# Patient Record
Sex: Female | Born: 1990 | Race: White | Hispanic: Yes | Marital: Single | Smoking: Never smoker
Health system: Southern US, Community
[De-identification: ages and names within clinical notes are randomized; demographics above are authoritative.]

## PROBLEM LIST (undated history)

## (undated) ENCOUNTER — Inpatient Hospital Stay (HOSPITAL_COMMUNITY): Payer: Self-pay

## (undated) DIAGNOSIS — E282 Polycystic ovarian syndrome: Secondary | ICD-10-CM

## (undated) DIAGNOSIS — E0590 Thyrotoxicosis, unspecified without thyrotoxic crisis or storm: Secondary | ICD-10-CM

## (undated) DIAGNOSIS — M419 Scoliosis, unspecified: Secondary | ICD-10-CM

## (undated) DIAGNOSIS — J45909 Unspecified asthma, uncomplicated: Secondary | ICD-10-CM

---
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
## 2011-12-30 ENCOUNTER — Inpatient Hospital Stay (HOSPITAL_COMMUNITY)
Admission: AD | Admit: 2011-12-30 | Discharge: 2011-12-30 | Disposition: A | Discharge status: Home / Self Care | Payer: Self-pay | Source: Ambulatory Visit | Attending: Obstetrics & Gynecology | Admitting: Obstetrics & Gynecology

## 2011-12-30 DIAGNOSIS — N949 Unspecified condition associated with female genital organs and menstrual cycle: Secondary | ICD-10-CM | POA: Insufficient documentation

## 2011-12-30 DIAGNOSIS — IMO0001 Reserved for inherently not codable concepts without codable children: Secondary | ICD-10-CM

## 2011-12-30 DIAGNOSIS — N938 Other specified abnormal uterine and vaginal bleeding: Secondary | ICD-10-CM | POA: Insufficient documentation

## 2011-12-30 DIAGNOSIS — R030 Elevated blood-pressure reading, without diagnosis of hypertension: Secondary | ICD-10-CM | POA: Insufficient documentation

## 2011-12-30 LAB — POCT PREGNANCY, URINE
Preg Test, Ur: NEGATIVE
Preg Test, Ur: NEGATIVE

## 2011-12-30 LAB — URINALYSIS, ROUTINE W REFLEX MICROSCOPIC
Ketones, ur: NEGATIVE mg/dL
Leukocytes, UA: NEGATIVE
Nitrite: POSITIVE — AB
Protein, ur: NEGATIVE mg/dL
pH: 5.5 (ref 5.0–8.0)

## 2011-12-30 NOTE — Discharge Instructions (Signed)
Hypertension Information As your heart beats, it forces blood through your arteries. This force is your blood pressure. If the pressure is too high, it is called hypertension (HTN) or high blood pressure. HTN is dangerous because you may have it and not know it. High blood pressure may mean that your heart has to work harder to pump blood. Your arteries may be narrow or stiff. The extra work puts you at risk for heart disease, stroke, and other problems.  Blood pressure consists of two numbers, a higher number over a lower, 110/72, for example. It is stated as "110 over 72." The ideal is below 120 for the top number (systolic) and under 80 for the bottom (diastolic).  You should pay close attention to your blood pressure if you have certain conditions such as:  Heart failure.   Prior heart attack.   Diabetes   Chronic kidney disease.   Prior stroke.   Multiple risk factors for heart disease.  To see if you have HTN, your blood pressure should be measured while you are seated with your arm held at the level of the heart. It should be measured at least twice. A one-time elevated blood pressure reading (especially in the Emergency Department) does not mean that you need treatment. There may be conditions in which the blood pressure is different between your right and left arms. It is important to see your caregiver soon for a recheck. Most people have essential hypertension which means that there is not a specific cause. This type of high blood pressure may be lowered by changing lifestyle factors such as:  Stress.   Smoking.   Lack of exercise.   Excessive weight.   Drug/tobacco/alcohol use.   Eating less salt.  Most people do not have symptoms from high blood pressure until it has caused damage to the body. Effective treatment can often prevent, delay or reduce that damage. TREATMENT  Treatment for high blood pressure, when a cause has been identified, is directed at the cause. There  are a large number of medications to treat HTN. These fall into several categories, and your caregiver will help you select the medicines that are best for you. Medications may have side effects. You should review side effects with your caregiver. If your blood pressure stays high after you have made lifestyle changes or started on medicines,   Your medication(s) may need to be changed.   Other problems may need to be addressed.   Be certain you understand your prescriptions, and know how and when to take your medicine.   Be sure to follow up with your caregiver within the time frame advised (usually within two weeks) to have your blood pressure rechecked and to review your medications.   If you are taking more than one medicine to lower your blood pressure, make sure you know how and at what times they should be taken. Taking two medicines at the same time can result in blood pressure that is too low.  Document Released: 12/06/2005 Document Revised: 06/15/2011 Document Reviewed: 12/13/2007 [facility] Patient Information 2012 [place], [place].Hypertension Hypertension is another name for high blood pressure. High blood pressure may mean that your heart needs to work harder to pump blood. Blood pressure consists of two numbers, which includes a higher number over a lower number (example: 110/72). HOME CARE   Make lifestyle changes as told by your doctor. This may include weight loss and exercise.   Take your blood pressure medicine every day.   Limit  how much salt you use.   Stop smoking if you smoke.   Do not use drugs.   Talk to your doctor if you are using decongestants or birth control pills. These medicines might make blood pressure higher.   Females should not drink more than 1 alcoholic drink per day. Males should not drink more than 2 alcoholic drinks per day.   See your doctor as told.  GET HELP RIGHT AWAY IF:   You have a blood pressure reading with a top number of 180 or  higher.   You get a very bad headache.   You get blurred or changing vision.   You feel confused.   You feel weak, numb, or faint.   You get chest or belly (abdominal) pain.   You throw up (vomit).   You cannot breathe very well.  MAKE SURE YOU:   Understand these instructions.   Will watch your condition.   Will get help right away if you are not doing well or get worse.  Document Released: 03/21/2008 Document Revised: 09/22/2011 Document Reviewed: 03/21/2008 [facility] Patient Information 2012 [place], [place].

## 2011-12-30 NOTE — MAU Note (Signed)
Pt states, " I missed my period on March 9 th, but spotted on Mon the 11 off and on until Wed and on Wed I had a severe pain at my belly button that lasted about 2 hrs. Today I have not had pain or bleeding. My pregnancy test was negative."

## 2011-12-30 NOTE — MAU Provider Note (Signed)
Debra A Booth20 y.o. Chief Complaint  Patient presents with  . Vaginal Bleeding    SUBJECTIVE  HPI: Nullip not using contraception requests preg test. Her HPT was neg 1 wk ago. LNMP 11/26/11. This months she had light flow 3/11-12 and moderate flow 12/28/11 and feels this is not normal for her. Sometimes her menses interval is 5 wks but does not skip months or have metrorrhagia.  Unaware of BP ever being elevated.  No past medical history on file. No past surgical history on file. History   Social History  . Marital Status: Married    Spouse Name: N/A    Number of Children: N/A  . Years of Education: N/A   Occupational History  . Not on file.   Social History Main Topics  . Smoking status: Not on file  . Smokeless tobacco: Not on file  . Alcohol Use: Not on file  . Drug Use: Not on file  . Sexually Active: Not on file   Other Topics Concern  . Not on file   Social History Narrative  . No narrative on file   No current facility-administered medications on file prior to encounter.   No current outpatient prescriptions on file prior to encounter.   Allergies not on file  ROS: Pertinent items in HPI  OBJECTIVE Blood pressure 134/93, pulse 119, temperature 97.4 F (36.3 C), temperature source Oral, resp. rate 20, height 5' 1.25" (1.556 m), weight 70.988 kg (156 lb 8 oz).  Initial BP 150/83  Results for orders placed during the hospital encounter of 12/30/11 (from the past 24 hour(s))  PREGNANCY, URINE     Status: Normal   Collection Time   12/30/11  3:55 PM      Component Value Range   Preg Test, Ur NEGATIVE  NEGATIVE   POCT PREGNANCY, URINE     Status: Normal   Collection Time   12/30/11  4:00 PM      Component Value Range   Preg Test, Ur NEGATIVE  NEGATIVE   POCT PREGNANCY, URINE     Status: Normal   Collection Time   12/30/11  4:17 PM      Component Value Range   Preg Test, Ur NEGATIVE  NEGATIVE    PE deferred  ASSESSMENT  Not pregnant Elevated  BP  PLAN Discussed contraception, keep a menstrual calendar, safe sex and need to have BP checked with [facility] or primary care provider; [facility] or [facility] for contraception.

## 2013-03-09 ENCOUNTER — Encounter (HOSPITAL_COMMUNITY): Payer: Self-pay | Admitting: *Deleted

## 2013-03-09 ENCOUNTER — Emergency Department (HOSPITAL_COMMUNITY)
Admission: EM | Admit: 2013-03-09 | Discharge: 2013-03-10 | Disposition: A | Discharge status: Home / Self Care | Payer: BC Managed Care – PPO | Attending: Emergency Medicine | Admitting: Emergency Medicine

## 2013-03-09 DIAGNOSIS — R1012 Left upper quadrant pain: Secondary | ICD-10-CM | POA: Insufficient documentation

## 2013-03-09 DIAGNOSIS — R109 Unspecified abdominal pain: Secondary | ICD-10-CM

## 2013-03-09 DIAGNOSIS — R110 Nausea: Secondary | ICD-10-CM | POA: Insufficient documentation

## 2013-03-09 DIAGNOSIS — R6883 Chills (without fever): Secondary | ICD-10-CM | POA: Insufficient documentation

## 2013-03-09 DIAGNOSIS — R61 Generalized hyperhidrosis: Secondary | ICD-10-CM | POA: Insufficient documentation

## 2013-03-09 DIAGNOSIS — Z880 Allergy status to penicillin: Secondary | ICD-10-CM | POA: Insufficient documentation

## 2013-03-09 DIAGNOSIS — K219 Gastro-esophageal reflux disease without esophagitis: Secondary | ICD-10-CM | POA: Insufficient documentation

## 2013-03-09 DIAGNOSIS — Z8739 Personal history of other diseases of the musculoskeletal system and connective tissue: Secondary | ICD-10-CM | POA: Insufficient documentation

## 2013-03-09 DIAGNOSIS — Z3202 Encounter for pregnancy test, result negative: Secondary | ICD-10-CM | POA: Insufficient documentation

## 2013-03-09 HISTORY — DX: Scoliosis, unspecified: M41.9

## 2013-03-09 LAB — COMPREHENSIVE METABOLIC PANEL
Alkaline Phosphatase: 120 U/L — ABNORMAL HIGH (ref 39–117)
BUN: 14 mg/dL (ref 6–23)
Calcium: 9.6 mg/dL (ref 8.4–10.5)
Creatinine, Ser: 0.81 mg/dL (ref 0.50–1.10)
GFR calc Af Amer: >90 mL/min (ref >90)
Glucose, Bld: 86 mg/dL (ref 70–99)
Potassium: 3.7 mEq/L (ref 3.5–5.1)
Total Protein: 7.7 g/dL (ref 6.0–8.3)

## 2013-03-09 LAB — CBC WITH DIFFERENTIAL/PLATELET
Eosinophils Absolute: 0.3 10*3/uL (ref 0.0–0.7)
Eosinophils Relative: 3 % (ref 0–5)
Hemoglobin: 13.5 g/dL (ref 12.0–15.0)
Lymphs Abs: 4.7 10*3/uL — ABNORMAL HIGH (ref 0.7–4.0)
MCH: 32.3 pg (ref 26.0–34.0)
MCV: 92.1 fL (ref 78.0–100.0)
Monocytes Absolute: 0.7 10*3/uL (ref 0.1–1.0)
Monocytes Relative: 6 % (ref 3–12)
RBC: 4.18 MIL/uL (ref 3.87–5.11)

## 2013-03-09 LAB — URINALYSIS, ROUTINE W REFLEX MICROSCOPIC
Bilirubin Urine: NEGATIVE
Nitrite: NEGATIVE
Specific Gravity, Urine: 1.02 (ref 1.005–1.030)
Urobilinogen, UA: 0.2 mg/dL (ref 0.0–1.0)

## 2013-03-09 LAB — LIPASE, BLOOD: Lipase: 38 U/L (ref 11–59)

## 2013-03-09 MED ORDER — HYDROCODONE-ACETAMINOPHEN 5-325 MG PO TABS
1.0000 | ORAL_TABLET | Freq: Once | ORAL | Status: AC
  Administered 2013-03-09: 1 via ORAL
  Filled 2013-03-09: qty 1

## 2013-03-09 NOTE — ED Notes (Addendum)
Patient with recurrent left sided abd pain x2 months described as stabbing and intermittent. Patient has nausea medications at home but no rx pain meds. Patient states pain is worse today. Patient denies taking any pain meds, including OTC, for pain PTA. Patient reports nausea is worsened when she does not eat for a long period of time. Patient states she eats twice daily. Patient states her WBC and Liver enzymes are both elevated per the GI dr from labs collected about 2 weeks ago.

## 2013-03-09 NOTE — ED Notes (Signed)
Pt states that she has had abdominal pain off and on x 2 months; pt has been seen by OB/GYN and GI; mom reports that they are waiting for test results from GI; pt c/o feeling nausea and abd pain again today; mom reports that GI advised that she had elevated WBC and something with her liver enzymes; mom is concerned that pain returned today and having these elevatiosn in her lab work.

## 2013-03-09 NOTE — ED Provider Notes (Signed)
History     CSN: [id number]  Arrival date & time 03/09/13  2106   First MD Initiated Contact with Patient 03/09/13 2234      Chief Complaint  Patient presents with  . Abdominal Pain   HPI  History provided by patient and mother. Patient is a 22 year old female with no significant PMH who presents with complaints of worsening abdominal pain and nausea symptoms. She has had persistent symptoms with some waxing and waning features for the past 2 months. One month ago she was initially evaluated by OB/GYN who referred her to a GI specialist. Patient was seen by Dr. Loreta Ave and states that she had blood testing performed. Recently she was called and told that she had slightly elevated liver enzymes as well as a slightly elevated WBC. Patient is planning to follow back up with GI but has no appointment at this time. Her pain is primarily in the upper left side of her abdomen. It's described as sharp and cramping. She denies having any changes in her bowel habits. Denies any constipation or diarrhea. She has usually one bowel movement every day. There has not been any blood or mucus. She has slight nausea but no change in appetite. Denies any vomiting episodes. There is any fever but does report occasional chills and sweats. Her symptoms are aggravated or alleviated by eating, position or defecation. Patient does have a prescription for Zofran which she uses for nausea. She is currently not using any medications including over-the-counter medications for pain symptoms. Denies any urinary symptoms. Denies any vaginal bleeding or vaginal discharge.   Past Medical History  Diagnosis Date  . Scoliosis     History reviewed. No pertinent past surgical history.  No family history on file.  History  Substance Use Topics  . Smoking status: Never Smoker   . Smokeless tobacco: Not on file  . Alcohol Use: No    OB History   Grav Para Term Preterm Abortions TAB SAB Ect Mult Living                   Review of Systems  Constitutional: Positive for chills and diaphoresis. Negative for fever.  Respiratory: Negative for shortness of breath.   Cardiovascular: Negative for chest pain.  Gastrointestinal: Positive for nausea and abdominal pain. Negative for vomiting, diarrhea, constipation and blood in stool.  Genitourinary: Negative for dysuria, frequency, hematuria, flank pain, vaginal bleeding and vaginal discharge.  All other systems reviewed and are negative.    Allergies  Amoxicillin  Home Medications   Current Outpatient Rx  Name  Route  Sig  Dispense  Refill  . PRESCRIPTION MEDICATION   Oral   Take 1 tablet by mouth every 8 (eight) hours as needed (nausea). Nausea med prescribed by obgyn           BP 117/82  Pulse 66  Temp(Src) 98.2 F (36.8 C) (Oral)  Resp 12  SpO2 100%  LMP 12/23/2012  Physical Exam  Nursing note and vitals reviewed. Constitutional: She is oriented to person, place, and time. She appears well-developed and well-nourished. No distress.  HENT:  Head: Normocephalic.  Cardiovascular: Normal rate and regular rhythm.   Pulmonary/Chest: Effort normal and breath sounds normal. No respiratory distress. She has no wheezes. She has no rales.  Abdominal: Soft. There is tenderness in the left upper quadrant. There is no rigidity, no rebound, no guarding, no CVA tenderness, no tenderness at McBurney's point and negative Murphy's sign.  Musculoskeletal: Normal range of motion.  Neurological: She is alert and oriented to person, place, and time.  Skin: Skin is warm and dry. No rash noted.  Psychiatric: She has a normal mood and affect. Her behavior is normal.    ED Course  Procedures   Results for orders placed during the hospital encounter of 03/09/13  CBC WITH DIFFERENTIAL      Result Value Range   WBC 11.7 (*) 4.0 - 10.5 K/uL   RBC 4.18  3.87 - 5.11 MIL/uL   Hemoglobin 13.5  12.0 - 15.0 g/dL   HCT 40.9  81.1 - 91.4 %   MCV 92.1  78.0 - 100.0  fL   MCH 32.3  26.0 - 34.0 pg   MCHC 35.1  30.0 - 36.0 g/dL   RDW 78.2  95.6 - 21.3 %   Platelets 309  150 - 400 K/uL   Neutrophils Relative % 51  43 - 77 %   Neutro Abs 5.9  1.7 - 7.7 K/uL   Lymphocytes Relative 40  12 - 46 %   Lymphs Abs 4.7 (*) 0.7 - 4.0 K/uL   Monocytes Relative 6  3 - 12 %   Monocytes Absolute 0.7  0.1 - 1.0 K/uL   Eosinophils Relative 3  0 - 5 %   Eosinophils Absolute 0.3  0.0 - 0.7 K/uL   Basophils Relative 1  0 - 1 %   Basophils Absolute 0.1  0.0 - 0.1 K/uL  COMPREHENSIVE METABOLIC PANEL      Result Value Range   Sodium 138  135 - 145 mEq/L   Potassium 3.7  3.5 - 5.1 mEq/L   Chloride 102  96 - 112 mEq/L   CO2 28  19 - 32 mEq/L   Glucose, Bld 86  70 - 99 mg/dL   BUN 14  6 - 23 mg/dL   Creatinine, Ser 0.86  0.50 - 1.10 mg/dL   Calcium 9.6  8.4 - 57.8 mg/dL   Total Protein 7.7  6.0 - 8.3 g/dL   Albumin 3.6  3.5 - 5.2 g/dL   AST 24  0 - 37 U/L   ALT 19  0 - 35 U/L   Alkaline Phosphatase 120 (*) 39 - 117 U/L   Total Bilirubin 0.3  0.3 - 1.2 mg/dL   GFR calc non Af Amer >90  >90 mL/min   GFR calc Af Amer >90  >90 mL/min  LIPASE, BLOOD      Result Value Range   Lipase 38  11 - 59 U/L  URINALYSIS, ROUTINE W REFLEX MICROSCOPIC      Result Value Range   Color, Urine YELLOW  YELLOW   APPearance CLEAR  CLEAR   Specific Gravity, Urine 1.020  1.005 - 1.030   pH 5.0  5.0 - 8.0   Glucose, UA NEGATIVE  NEGATIVE mg/dL   Hgb urine dipstick NEGATIVE  NEGATIVE   Bilirubin Urine NEGATIVE  NEGATIVE   Ketones, ur NEGATIVE  NEGATIVE mg/dL   Protein, ur NEGATIVE  NEGATIVE mg/dL   Urobilinogen, UA 0.2  0.0 - 1.0 mg/dL   Nitrite NEGATIVE  NEGATIVE   Leukocytes, UA NEGATIVE  NEGATIVE  POCT PREGNANCY, URINE      Result Value Range   Preg Test, Ur NEGATIVE  NEGATIVE        1. Abdominal pain   2. GERD (gastroesophageal reflux disease)       MDM  10:40 PM patient seen and evaluated. Patient is sitting appears comfortable in the bed in no  acute distress.  Symptoms have been ongoing for 2 months.   Patient reports feeling much better after pain medications. She has soft unremarkable abdominal exam with mild pains in the left upper quadrant. No signs of acute abdomen. Labs without any significant concerning findings. At this time recommended patient began with her antacid medications to see if symptoms improve over the next one to 2 weeks. Patient also instructed to continue following up with her GI specialist.     Angus Seller, PA-C 03/10/13 [phone]

## 2013-03-10 NOTE — ED Notes (Signed)
Patient discharged during downtime. Please see paper charting. 

## 2013-03-11 NOTE — ED Provider Notes (Signed)
Medical screening examination/treatment/procedure(s) were performed by non-physician practitioner and as supervising physician I was immediately available for consultation/collaboration.    Vida Roller, MD 03/11/13 [phone]

## 2013-05-27 ENCOUNTER — Emergency Department (HOSPITAL_COMMUNITY): Payer: BC Managed Care – PPO

## 2013-05-27 ENCOUNTER — Emergency Department (HOSPITAL_COMMUNITY)
Admission: EM | Admit: 2013-05-27 | Discharge: 2013-05-28 | Disposition: A | Discharge status: Home / Self Care | Payer: BC Managed Care – PPO | Attending: Emergency Medicine | Admitting: Emergency Medicine

## 2013-05-27 ENCOUNTER — Encounter (HOSPITAL_COMMUNITY): Payer: Self-pay | Admitting: Emergency Medicine

## 2013-05-27 DIAGNOSIS — R5383 Other fatigue: Secondary | ICD-10-CM | POA: Insufficient documentation

## 2013-05-27 DIAGNOSIS — Z3202 Encounter for pregnancy test, result negative: Secondary | ICD-10-CM | POA: Insufficient documentation

## 2013-05-27 DIAGNOSIS — B349 Viral infection, unspecified: Secondary | ICD-10-CM

## 2013-05-27 DIAGNOSIS — R5381 Other malaise: Secondary | ICD-10-CM | POA: Insufficient documentation

## 2013-05-27 DIAGNOSIS — B9789 Other viral agents as the cause of diseases classified elsewhere: Secondary | ICD-10-CM | POA: Insufficient documentation

## 2013-05-27 DIAGNOSIS — R109 Unspecified abdominal pain: Secondary | ICD-10-CM | POA: Insufficient documentation

## 2013-05-27 DIAGNOSIS — R110 Nausea: Secondary | ICD-10-CM | POA: Insufficient documentation

## 2013-05-27 DIAGNOSIS — IMO0001 Reserved for inherently not codable concepts without codable children: Secondary | ICD-10-CM | POA: Insufficient documentation

## 2013-05-27 DIAGNOSIS — R51 Headache: Secondary | ICD-10-CM | POA: Insufficient documentation

## 2013-05-27 DIAGNOSIS — M4120 Other idiopathic scoliosis, site unspecified: Secondary | ICD-10-CM | POA: Insufficient documentation

## 2013-05-27 LAB — CBC WITH DIFFERENTIAL/PLATELET
Basophils Absolute: 0 10*3/uL (ref 0.0–0.1)
Basophils Relative: 0 % (ref 0–1)
Eosinophils Absolute: 0.2 10*3/uL (ref 0.0–0.7)
Eosinophils Relative: 2 % (ref 0–5)
HCT: 42.6 % (ref 36.0–46.0)
MCH: 32.8 pg (ref 26.0–34.0)
MCHC: 36.6 g/dL — ABNORMAL HIGH (ref 30.0–36.0)
MCV: 89.5 fL (ref 78.0–100.0)
Monocytes Absolute: 0.5 10*3/uL (ref 0.1–1.0)
RDW: 11.7 % (ref 11.5–15.5)

## 2013-05-27 LAB — URINALYSIS, ROUTINE W REFLEX MICROSCOPIC
Glucose, UA: NEGATIVE mg/dL
Hgb urine dipstick: NEGATIVE
Ketones, ur: NEGATIVE mg/dL
Leukocytes, UA: NEGATIVE
Protein, ur: NEGATIVE mg/dL

## 2013-05-27 LAB — COMPREHENSIVE METABOLIC PANEL
AST: 21 U/L (ref 0–37)
Albumin: 3.8 g/dL (ref 3.5–5.2)
Calcium: 9.9 mg/dL (ref 8.4–10.5)
Creatinine, Ser: 0.66 mg/dL (ref 0.50–1.10)

## 2013-05-27 MED ORDER — SODIUM CHLORIDE 0.9 % IV BOLUS (SEPSIS)
1000.0000 mL | Freq: Once | INTRAVENOUS | Status: AC
  Administered 2013-05-27: 1000 mL via INTRAVENOUS

## 2013-05-27 MED ORDER — SODIUM CHLORIDE 0.9 % IV SOLN
INTRAVENOUS | Status: DC
  Administered 2013-05-27: 22:00:00 via INTRAVENOUS

## 2013-05-27 MED ORDER — ONDANSETRON 4 MG PO TBDP: 4.0000 mg | ORAL_TABLET | Freq: Three times a day (TID) | ORAL | Status: DC | PRN

## 2013-05-27 MED ORDER — PROMETHAZINE HCL 25 MG PO TABS: 25.0000 mg | ORAL_TABLET | Freq: Four times a day (QID) | ORAL | Status: DC | PRN

## 2013-05-27 MED ORDER — ONDANSETRON HCL 4 MG/2ML IJ SOLN
4.0000 mg | Freq: Once | INTRAMUSCULAR | Status: AC
  Administered 2013-05-27: 4 mg via INTRAVENOUS
  Filled 2013-05-27: qty 2

## 2013-05-27 NOTE — ED Notes (Signed)
Pt c/o nausea x 2 days and chills

## 2013-05-28 NOTE — ED Provider Notes (Signed)
CSN: [id number]     Arrival date & time 05/27/13  1639 History     First MD Initiated Contact with Patient 05/27/13 2043     Chief Complaint  Patient presents with  . Chills  . Nausea   (Consider location/radiation/quality/duration/timing/severity/associated sxs/prior Treatment) The history is provided by the patient and a parent.   22 year old female with 2 day history of fever chills and nausea no vomiting the bodyaches mild headache no dysuria plus or minus abdominal pain. No history of recent travel no history of recent bites.  Past Medical History  Diagnosis Date  . Scoliosis    History reviewed. No pertinent past surgical history. History reviewed. No pertinent family history. History  Substance Use Topics  . Smoking status: Never Smoker   . Smokeless tobacco: Not on file  . Alcohol Use: No   OB History   Grav Para Term Preterm Abortions TAB SAB Ect Mult Living                 Review of Systems  Constitutional: Positive for fever, chills and fatigue.  HENT: Negative for congestion and neck pain.   Eyes: Negative for redness.  Respiratory: Negative for cough and shortness of breath.   Cardiovascular: Negative for chest pain and leg swelling.  Gastrointestinal: Positive for nausea and abdominal pain. Negative for vomiting and diarrhea.  Genitourinary: Negative for dysuria.  Musculoskeletal: Positive for myalgias.  Skin: Negative for rash.  Allergic/Immunologic: Negative for immunocompromised state.  Neurological: Positive for headaches.  Hematological: Does not bruise/bleed easily.  Psychiatric/Behavioral: Negative for confusion.    Allergies  Amoxicillin  Home Medications   Current Outpatient Rx  Name  Route  Sig  Dispense  Refill  . acetaminophen (TYLENOL) 500 MG tablet   Oral   Take 1,000 mg by mouth daily as needed for pain.         Marland Kitchen ondansetron (ZOFRAN ODT) 4 MG disintegrating tablet   Oral   Take 1 tablet (4 mg total) by mouth every 8 (eight)  hours as needed for nausea.   10 tablet   0   . promethazine (PHENERGAN) 25 MG tablet   Oral   Take 1 tablet (25 mg total) by mouth every 6 (six) hours as needed for nausea.   12 tablet   0    BP 114/61  Pulse 93  Temp(Src) 97.6 F (36.4 C) (Oral)  Resp 17  SpO2 99%  LMP 05/07/2013 Physical Exam  Nursing note and vitals reviewed. Constitutional: She is oriented to person, place, and time. She appears well-developed and well-nourished. No distress.  HENT:  Head: Normocephalic and atraumatic.  Mouth/Throat: Oropharynx is clear and moist. No oropharyngeal exudate.  Eyes: Conjunctivae and EOM are normal. Pupils are equal, round, and reactive to light.  Neck: Normal range of motion. Neck supple.  Cardiovascular: Normal rate, regular rhythm and intact distal pulses.   No murmur heard. Pulmonary/Chest: Effort normal and breath sounds normal. No respiratory distress.  Abdominal: Soft. Bowel sounds are normal. There is no tenderness.  Musculoskeletal: Normal range of motion. She exhibits no edema.  Lymphadenopathy:    She has no cervical adenopathy.  Neurological: She is alert and oriented to person, place, and time. No cranial nerve deficit. She exhibits normal muscle tone. Coordination normal.  Skin: Skin is warm. No rash noted.    ED Course   Procedures (including critical care time)  Labs Reviewed  URINALYSIS, ROUTINE W REFLEX MICROSCOPIC - Abnormal; Notable for the following:  APPearance CLOUDY (*)    All other components within normal limits  CBC WITH DIFFERENTIAL - Abnormal; Notable for the following:    WBC 11.2 (*)    Hemoglobin 15.6 (*)    MCHC 36.6 (*)    Lymphs Abs 4.8 (*)    All other components within normal limits  COMPREHENSIVE METABOLIC PANEL   Dg Chest 2 View  05/27/2013   *RADIOLOGY REPORT*  Clinical Data: Cold symptoms  CHEST - 2 VIEW  Comparison: 09/02/2010  Findings: Rightward curvature of the thoracic spine is noted centered at T8.  Cardiomediastinal silhouette is within normal limits. The lungs are clear. No pleural effusion.  No pneumothorax. No acute osseous abnormality.  IMPRESSION: No acute cardiopulmonary process.   Original Report Authenticated By: Christiana Pellant, M.D.    Results for orders placed during the hospital encounter of 05/27/13  URINALYSIS, ROUTINE W REFLEX MICROSCOPIC      Result Value Range   Color, Urine YELLOW  YELLOW   APPearance CLOUDY (*) CLEAR   Specific Gravity, Urine 1.014  1.005 - 1.030   pH 6.0  5.0 - 8.0   Glucose, UA NEGATIVE  NEGATIVE mg/dL   Hgb urine dipstick NEGATIVE  NEGATIVE   Bilirubin Urine NEGATIVE  NEGATIVE   Ketones, ur NEGATIVE  NEGATIVE mg/dL   Protein, ur NEGATIVE  NEGATIVE mg/dL   Urobilinogen, UA 0.2  0.0 - 1.0 mg/dL   Nitrite NEGATIVE  NEGATIVE   Leukocytes, UA NEGATIVE  NEGATIVE  CBC WITH DIFFERENTIAL      Result Value Range   WBC 11.2 (*) 4.0 - 10.5 K/uL   RBC 4.76  3.87 - 5.11 MIL/uL   Hemoglobin 15.6 (*) 12.0 - 15.0 g/dL   HCT 16.1  09.6 - 04.5 %   MCV 89.5  78.0 - 100.0 fL   MCH 32.8  26.0 - 34.0 pg   MCHC 36.6 (*) 30.0 - 36.0 g/dL   RDW 40.9  81.1 - 91.4 %   Platelets 346  150 - 400 K/uL   Neutrophils Relative % 51  43 - 77 %   Neutro Abs 5.7  1.7 - 7.7 K/uL   Lymphocytes Relative 43  12 - 46 %   Lymphs Abs 4.8 (*) 0.7 - 4.0 K/uL   Monocytes Relative 4  3 - 12 %   Monocytes Absolute 0.5  0.1 - 1.0 K/uL   Eosinophils Relative 2  0 - 5 %   Eosinophils Absolute 0.2  0.0 - 0.7 K/uL   Basophils Relative 0  0 - 1 %   Basophils Absolute 0.0  0.0 - 0.1 K/uL  COMPREHENSIVE METABOLIC PANEL      Result Value Range   Sodium 141  135 - 145 mEq/L   Potassium 4.2  3.5 - 5.1 mEq/L   Chloride 104  96 - 112 mEq/L   CO2 25  19 - 32 mEq/L   Glucose, Bld 99  70 - 99 mg/dL   BUN 13  6 - 23 mg/dL   Creatinine, Ser 7.82  0.50 - 1.10 mg/dL   Calcium 9.9  8.4 - 95.6 mg/dL   Total Protein 7.9  6.0 - 8.3 g/dL   Albumin 3.8  3.5 - 5.2 g/dL   AST 21  0 - 37 U/L   ALT  15  0 - 35 U/L   Alkaline Phosphatase 100  39 - 117 U/L   Total Bilirubin 0.3  0.3 - 1.2 mg/dL   GFR calc non Af Amer >90  >  90 mL/min   GFR calc Af Amer >90  >90 mL/min    1. Viral illness   2. Nausea     MDM   Suspect viral process. Patient's workup without evidence of urinary tract infection significant leukocytosis electrolyte abnormalities pregnancy test also negative. Chest x-ray negative. Patient may of been exposed to any nausea vomiting illness is going on in the community and does not eliciting all the symptoms. Patient is nontoxic no acute distress no abdominal tenderness. Will treat with anti-emetics.   Shelda Jakes, MD 05/28/13 0005

## 2013-09-11 ENCOUNTER — Encounter (HOSPITAL_COMMUNITY): Payer: Self-pay | Admitting: Emergency Medicine

## 2013-09-11 ENCOUNTER — Emergency Department (HOSPITAL_COMMUNITY)
Admission: EM | Admit: 2013-09-11 | Discharge: 2013-09-11 | Disposition: A | Discharge status: Home / Self Care | Payer: BC Managed Care – PPO | Attending: Emergency Medicine | Admitting: Emergency Medicine

## 2013-09-11 ENCOUNTER — Emergency Department (HOSPITAL_COMMUNITY): Payer: BC Managed Care – PPO

## 2013-09-11 DIAGNOSIS — R079 Chest pain, unspecified: Secondary | ICD-10-CM | POA: Insufficient documentation

## 2013-09-11 DIAGNOSIS — J45901 Unspecified asthma with (acute) exacerbation: Secondary | ICD-10-CM | POA: Insufficient documentation

## 2013-09-11 DIAGNOSIS — Z8739 Personal history of other diseases of the musculoskeletal system and connective tissue: Secondary | ICD-10-CM | POA: Insufficient documentation

## 2013-09-11 HISTORY — DX: Unspecified asthma, uncomplicated: J45.909

## 2013-09-11 MED ORDER — PREDNISONE 20 MG PO TABS
60.0000 mg | ORAL_TABLET | Freq: Once | ORAL | Status: AC
  Administered 2013-09-11: 60 mg via ORAL
  Filled 2013-09-11: qty 3

## 2013-09-11 MED ORDER — ALBUTEROL SULFATE (5 MG/ML) 0.5% IN NEBU
5.0000 mg | INHALATION_SOLUTION | Freq: Once | RESPIRATORY_TRACT | Status: AC
  Administered 2013-09-11: 5 mg via RESPIRATORY_TRACT
  Filled 2013-09-11: qty 1

## 2013-09-11 MED ORDER — PREDNISONE 20 MG PO TABS: 60.0000 mg | ORAL_TABLET | Freq: Every day | ORAL | Status: DC

## 2013-09-11 MED ORDER — IPRATROPIUM BROMIDE 0.02 % IN SOLN
0.5000 mg | Freq: Once | RESPIRATORY_TRACT | Status: AC
  Administered 2013-09-11: 0.5 mg via RESPIRATORY_TRACT
  Filled 2013-09-11: qty 2.5

## 2013-09-11 MED ORDER — ALBUTEROL SULFATE HFA 108 (90 BASE) MCG/ACT IN AERS: 2.0000 | INHALATION_SPRAY | RESPIRATORY_TRACT | Status: DC | PRN

## 2013-09-11 NOTE — ED Provider Notes (Signed)
TIME SEEN: 7:57 AM  CHIEF COMPLAINT: Chest pain, shortness of breath, wheezing  HPI: Patient is a 22 year old female with a history of asthma who presents to the emergency department with mild, central chest tightness without radiation, shortness of breath that started at 4:30 this morning. She states she's also had wheezing. No aggravating or alleviating factors. Last week she had a cough, congestion. No fevers or chills. No nausea, vomiting or diarrhea. She states this feels similar to her asthma exacerbations. She tried albuterol inhaler at home with minimal relief. She has never been intubated for her asthma before. Denies a history of hypertension, diabetes, hyperlipidemia, tobacco use. No prior history of lower extremity swelling or pain. No recent prolonged immobilization such as long flight or hospitalization, fracture, trauma, surgery.  Patient is on oral birth control tablets.  ROS: See HPI Constitutional: no fever  Eyes: no drainage  ENT: no runny nose   Cardiovascular:  chest pain  Resp: SOB  GI: no vomiting GU: no dysuria Integumentary: no rash  Allergy: no hives  Musculoskeletal: no leg swelling  Neurological: no slurred speech ROS otherwise negative  PAST MEDICAL HISTORY/PAST SURGICAL HISTORY:  Past Medical History  Diagnosis Date  . Scoliosis   . Asthma     MEDICATIONS:  Prior to Admission medications   Medication Sig Start Date End Date Taking? Authorizing Provider  acetaminophen (TYLENOL) 500 MG tablet Take 1,000 mg by mouth daily as needed for pain.    Historical Provider, MD  ondansetron (ZOFRAN ODT) 4 MG disintegrating tablet Take 1 tablet (4 mg total) by mouth every 8 (eight) hours as needed for nausea. 05/27/13   Shelda Jakes, MD  promethazine (PHENERGAN) 25 MG tablet Take 1 tablet (25 mg total) by mouth every 6 (six) hours as needed for nausea. 05/27/13   Shelda Jakes, MD    ALLERGIES:  Allergies  Allergen Reactions  . Amoxicillin Nausea And  Vomiting    SOCIAL HISTORY:  History  Substance Use Topics  . Smoking status: Never Smoker   . Smokeless tobacco: Not on file  . Alcohol Use: No    FAMILY HISTORY: History reviewed. No pertinent family history.  EXAM: BP 120/76  Pulse 93  Temp(Src) 98.1 F (36.7 C) (Oral)  Resp 20  Ht 5\' 1"  (1.549 m)  Wt 151 lb 4.8 oz (68.629 kg)  BMI 28.60 kg/m2  SpO2 98% CONSTITUTIONAL: Alert and oriented and responds appropriately to questions. Well-appearing; well-nourished HEAD: Normocephalic EYES: Conjunctivae clear, PERRL ENT: normal nose; no rhinorrhea; moist mucous membranes; pharynx without lesions noted NECK: Supple, no meningismus, no LAD  CARD: RRR; S1 and S2 appreciated; no murmurs, no clicks, no rubs, no gallops RESP: Normal chest excursion without splinting or tachypnea; breath sounds clear and equal bilaterally; no wheezes, no rhonchi, no rales, no respiratory distress, and no increased work of breathing ABD/GI: Normal bowel sounds; non-distended; soft, non-tender, no rebound, no guarding BACK:  The back appears normal and is non-tender to palpation, there is no CVA tenderness EXT: Normal ROM in all joints; non-tender to palpation; no edema; normal capillary refill; no cyanosis    SKIN: Normal color for age and race; warm NEURO: Moves all extremities equally PSYCH: The patient's mood and manner are appropriate. Grooming and personal hygiene are appropriate.  MEDICAL DECISION MAKING: Patient here with chest pain and shortness of breath that started at 4:30 this morning. She states this feels similar to her prior asthma exacerbations. On exam however patient is in no respiratory distress,  lungs clear to auscultation without wheezing with good air movement, oxygen 100% on room air. Will give one DuoNeb for symptomatic relief and reassess. We'll also give prednisone. EKG shows no acute abnormalities. We'll also obtain chest x-ray to rule out infiltrate, pneumothorax.  EKG  Interpretation    Date/Time:  Wednesday September 11 2013 07:33:33 EST Ventricular Rate:  89 PR Interval:  140 QRS Duration: 82 QT Interval:  318 QTC Calculation: 386 R Axis:   54 Text Interpretation:  Normal sinus rhythm with sinus arrhythmia Possible Left atrial enlargement Borderline ECG Confirmed by Jamirra Curnow  DO, Hasani Diemer ([id number]) on 09/11/2013 7:57:21 AM             ED PROGRESS: Patient reports that her symptoms have improved after one DuoNeb. She states she would feel better after second neb.   Patient reports feeling much better after second duoneb. Her chest x-ray is clear. Lungs are still clear to auscultation with good aeration oxygen saturation 100% on room air. We'll discharge home with return precautions and refill for albuterol, prescription for prednisone. Patient and mother at bedside verbalize understanding and are comfortable with plan.  Layla Maw Karelyn Brisby, DO 09/11/13 [phone]

## 2013-09-11 NOTE — ED Notes (Signed)
Pt c/o mid sternal CP and SOB this am; pt sts recent URI sx and has hx of asthma

## 2015-07-16 ENCOUNTER — Inpatient Hospital Stay (HOSPITAL_COMMUNITY): Payer: 59

## 2015-07-16 ENCOUNTER — Encounter (HOSPITAL_COMMUNITY): Payer: Self-pay | Admitting: *Deleted

## 2015-07-16 ENCOUNTER — Inpatient Hospital Stay (HOSPITAL_COMMUNITY)
Admission: AD | Admit: 2015-07-16 | Discharge: 2015-07-16 | Disposition: A | Discharge status: Home / Self Care | Payer: 59 | Source: Ambulatory Visit | Attending: Obstetrics & Gynecology | Admitting: Obstetrics & Gynecology

## 2015-07-16 DIAGNOSIS — Z3A11 11 weeks gestation of pregnancy: Secondary | ICD-10-CM | POA: Diagnosis not present

## 2015-07-16 DIAGNOSIS — D573 Sickle-cell trait: Secondary | ICD-10-CM | POA: Diagnosis present

## 2015-07-16 DIAGNOSIS — J45909 Unspecified asthma, uncomplicated: Secondary | ICD-10-CM | POA: Diagnosis present

## 2015-07-16 DIAGNOSIS — R1032 Left lower quadrant pain: Secondary | ICD-10-CM | POA: Insufficient documentation

## 2015-07-16 DIAGNOSIS — O26891 Other specified pregnancy related conditions, first trimester: Secondary | ICD-10-CM | POA: Diagnosis not present

## 2015-07-16 DIAGNOSIS — M419 Scoliosis, unspecified: Secondary | ICD-10-CM | POA: Diagnosis present

## 2015-07-16 DIAGNOSIS — E0590 Thyrotoxicosis, unspecified without thyrotoxic crisis or storm: Secondary | ICD-10-CM | POA: Diagnosis not present

## 2015-07-16 HISTORY — DX: Thyrotoxicosis, unspecified without thyrotoxic crisis or storm: E05.90

## 2015-07-16 LAB — WET PREP, GENITAL
TRICH WET PREP: NONE SEEN
YEAST WET PREP: NONE SEEN

## 2015-07-16 LAB — URINALYSIS, ROUTINE W REFLEX MICROSCOPIC
Bilirubin Urine: NEGATIVE
Glucose, UA: NEGATIVE mg/dL
Hgb urine dipstick: NEGATIVE
Ketones, ur: NEGATIVE mg/dL
LEUKOCYTES UA: NEGATIVE
NITRITE: NEGATIVE
PH: 6 (ref 5.0–8.0)
Protein, ur: NEGATIVE mg/dL
SPECIFIC GRAVITY, URINE: 1.015 (ref 1.005–1.030)
Urobilinogen, UA: 0.2 mg/dL (ref 0.0–1.0)

## 2015-07-16 LAB — POCT PREGNANCY, URINE: Preg Test, Ur: POSITIVE — AB

## 2015-07-16 MED ORDER — METRONIDAZOLE 500 MG PO TABS: 500.0000 mg | ORAL_TABLET | Freq: Two times a day (BID) | ORAL | Status: AC

## 2015-07-16 NOTE — MAU Provider Note (Signed)
History   24 yo G1P0 at 46 3/7 weeks by uncertain LMP presented unannounced c/o LLQ pain since 0530, extending down into groin.  Felt when she turned over in bed.  Has worsened some today.  Mild nausea, but no vomiting, fever, dysuria, constipation, diarrhea, bleeding, anorexia, discharge, frequency, or any other sx. More noticeable when she turns or walks.  "Just wanted to make sure everything is OK with the baby".  Had NOB interview yesterday, with PN labs and GC/chlamydia done. Scheduled for PNV and [place] for dating on 10/10. Hx of irregular cycles.  Had GI consult in 2014 for abdominal pain, with no significant findings.  Patient reports today's pain is "different than that".  Patient Active Problem List   Diagnosis Date Noted  . Scoliosis 07/16/2015  . Hyperthyroidism 07/16/2015  . Asthma 07/16/2015  . Sickle cell trait 07/16/2015  Followed by Dr. Sharl Ma in past for hyperthyroidism, but no labs/evaluation in last several years. Irwin trait by hx, Hgb electrophoresis pending.  Chief Complaint  Patient presents with  . Abdominal Pain   HPI:  As above  OB History    Gravida Para Term Preterm AB TAB SAB Ectopic Multiple Living   1               Past Medical History  Diagnosis Date  . Scoliosis   . Asthma   . Hyperthyroidism     History reviewed. No pertinent past surgical history.  Family History  Problem Relation Age of Onset  . Diabetes Mother     type 2  . Hypothyroidism Mother   . Asthma Father   . Asthma Sister   . Hypothyroidism Sister     Social History  Substance Use Topics  . Smoking status: Never Smoker   . Smokeless tobacco: None  . Alcohol Use: No    Allergies:  Allergies  Allergen Reactions  . Amoxicillin Hives and Nausea And Vomiting    Prescriptions prior to admission  Medication Sig Dispense Refill Last Dose  . Prenatal Vit-Fe Fumarate-FA (PRENATAL MULTIVITAMIN) TABS tablet Take 1 tablet by mouth daily at 12 noon.   07/15/2015 at Unknown time   . albuterol (PROVENTIL HFA;VENTOLIN HFA) 108 (90 BASE) MCG/ACT inhaler Inhale 2 puffs into the lungs every 4 (four) hours as needed for wheezing or shortness of breath. 1 Inhaler 0     ROS:  LLQ pain Physical Exam   Last menstrual period 04/27/2015.   Physical Exam  In NAD Chest clear Heart RRR without murmur Abd gravid, mild LLQ tenderness, extending down into groin.  No rebound or guarding. Negative CVAT Pelvic--small amount white d/c in vault, cervix closed/long, NT.  FH approx 11-12 weeks. Ext WNL  FHR 170 by doppler  ED Course  Assessment: IUP at 11 3/7 weeks by unsure LMP LLQ pain  Plan: Wet prep [place] for dating and evaluation of LLQ pain.   Nigel Bridgeman CNM, MSN 07/16/2015 3:28 PM  Addendum: Returned from [place]:  SIUP, 11 5/7 weeks, EDC 01/30/16, left ovary WNL, unable to see right.  Results for orders placed or performed during the hospital encounter of 07/16/15 (from the past 24 hour(s))  Urinalysis, Routine w reflex microscopic (not at [facility])     Status: None   Collection Time: 07/16/15  2:32 PM  Result Value Ref Range   Color, Urine YELLOW YELLOW   APPearance CLEAR CLEAR   Specific Gravity, Urine 1.015 1.005 - 1.030   pH 6.0 5.0 - 8.0   Glucose, UA  NEGATIVE NEGATIVE mg/dL   Hgb urine dipstick NEGATIVE NEGATIVE   Bilirubin Urine NEGATIVE NEGATIVE   Ketones, ur NEGATIVE NEGATIVE mg/dL   Protein, ur NEGATIVE NEGATIVE mg/dL   Urobilinogen, UA 0.2 0.0 - 1.0 mg/dL   Nitrite NEGATIVE NEGATIVE   Leukocytes, UA NEGATIVE NEGATIVE  Pregnancy, urine POC     Status: Abnormal   Collection Time: 07/16/15  2:36 PM  Result Value Ref Range   Preg Test, Ur POSITIVE (A) NEGATIVE  Wet prep, genital     Status: Abnormal   Collection Time: 07/16/15  3:15 PM  Result Value Ref Range   Yeast Wet Prep HPF POC NONE SEEN NONE SEEN   Trich, Wet Prep NONE SEEN NONE SEEN   Clue Cells Wet Prep HPF POC MODERATE (A) NONE SEEN   WBC, Wet Prep HPF POC FEW (A) NONE SEEN     Impression: IUP at 11 5/7 weeks LLQ pain, likely musculoskeletal BV  Plan: D/C home with comfort measures reviewed. Rx MTZ BID x 7 days Keep scheduled NOB w/u on 10/10--US previously scheduled for that day cancelled by me. Patient declines genetic testing.  Ray Church 07/16/15 5:50p

## 2015-07-16 NOTE — Discharge Instructions (Signed)
Abdominal Pain During Pregnancy °Belly (abdominal) pain is common during pregnancy. Most of the time, it is not a serious problem. Other times, it can be a sign that something is wrong with the pregnancy. Always tell your doctor if you have belly pain. °HOME CARE °Monitor your belly pain for any changes. The following actions may help you feel better: °· Do not have sex (intercourse) or put anything in your vagina until you feel better. °· Rest until your pain stops. °· Drink clear fluids if you feel sick to your stomach (nauseous). Do not eat solid food until you feel better. °· Only take medicine as told by your doctor. °· Keep all doctor visits as told. °GET HELP RIGHT AWAY IF:  °· You are bleeding, leaking fluid, or pieces of tissue come out of your vagina. °· You have more pain or cramping. °· You keep throwing up (vomiting). °· You have pain when you pee (urinate) or have blood in your pee. °· You have a fever. °· You do not feel your baby moving as much. °· You feel very weak or feel like passing out. °· You have trouble breathing, with or without belly pain. °· You have a very bad headache and belly pain. °· You have fluid leaking from your vagina and belly pain. °· You keep having watery poop (diarrhea). °· Your belly pain does not go away after resting, or the pain gets worse. °MAKE SURE YOU:  °· Understand these instructions. °· Will watch your condition. °· Will get help right away if you are not doing well or get worse. °Document Released: 09/21/2009 Document Revised: 06/05/2013 Document Reviewed: 05/02/2013 °ExitCare® Patient Information ©2015 [employer]. This information is not intended to replace advice given to you by your health care provider. Make sure you discuss any questions you have with your health care provider. °Bacterial Vaginosis °Bacterial vaginosis is an infection of the vagina. It happens when too many of certain germs (bacteria) grow in the vagina. °HOME CARE °· Take your medicine  as told by your doctor. °· Finish your medicine even if you start to feel better. °· Do not have sex until you finish your medicine and are better. °· Tell your sex partner that you have an infection. They should see their doctor for treatment. °· Practice safe sex. Use condoms. Have only one sex partner. °GET HELP IF: °· You are not getting better after 3 days of treatment. °· You have more grey fluid (discharge) coming from your vagina than before. °· You have more pain than before. °· You have a fever. °MAKE SURE YOU:  °· Understand these instructions. °· Will watch your condition. °· Will get help right away if you are not doing well or get worse. °Document Released: 07/12/2008 Document Revised: 07/24/2013 Document Reviewed: 05/15/2013 °ExitCare® Patient Information ©2015 [employer]. This information is not intended to replace advice given to you by your health care provider. Make sure you discuss any questions you have with your health care provider. ° °

## 2015-07-16 NOTE — MAU Note (Signed)
Pt reports having a sharp LLQ pain off and on all morning. No bleeding or vag discharge. Started care yesterday at [facility].

## 2015-07-17 LAB — OB RESULTS CONSOLE RUBELLA ANTIBODY, IGM: RUBELLA: IMMUNE

## 2015-07-17 LAB — OB RESULTS CONSOLE ABO/RH: RH Type: POSITIVE

## 2015-07-17 LAB — OB RESULTS CONSOLE GC/CHLAMYDIA
Chlamydia: NEGATIVE
Gonorrhea: NEGATIVE

## 2015-07-17 LAB — OB RESULTS CONSOLE HIV ANTIBODY (ROUTINE TESTING): HIV: NONREACTIVE

## 2015-07-17 LAB — OB RESULTS CONSOLE RPR: RPR: NONREACTIVE

## 2015-07-17 LAB — OB RESULTS CONSOLE HEPATITIS B SURFACE ANTIGEN: Hepatitis B Surface Ag: NEGATIVE

## 2015-08-19 ENCOUNTER — Encounter (HOSPITAL_COMMUNITY): Payer: Self-pay | Admitting: *Deleted

## 2015-08-19 ENCOUNTER — Inpatient Hospital Stay (HOSPITAL_COMMUNITY): Payer: BLUE CROSS/BLUE SHIELD

## 2015-08-19 ENCOUNTER — Inpatient Hospital Stay (HOSPITAL_COMMUNITY)
Admission: AD | Admit: 2015-08-19 | Discharge: 2015-08-19 | Disposition: A | Discharge status: Home / Self Care | Payer: BLUE CROSS/BLUE SHIELD | Source: Ambulatory Visit | Attending: Obstetrics & Gynecology | Admitting: Obstetrics & Gynecology

## 2015-08-19 DIAGNOSIS — R109 Unspecified abdominal pain: Secondary | ICD-10-CM

## 2015-08-19 DIAGNOSIS — Z3A16 16 weeks gestation of pregnancy: Secondary | ICD-10-CM | POA: Insufficient documentation

## 2015-08-19 DIAGNOSIS — Z880 Allergy status to penicillin: Secondary | ICD-10-CM | POA: Insufficient documentation

## 2015-08-19 DIAGNOSIS — O26892 Other specified pregnancy related conditions, second trimester: Secondary | ICD-10-CM | POA: Diagnosis not present

## 2015-08-19 DIAGNOSIS — R1032 Left lower quadrant pain: Secondary | ICD-10-CM | POA: Diagnosis not present

## 2015-08-19 DIAGNOSIS — N939 Abnormal uterine and vaginal bleeding, unspecified: Secondary | ICD-10-CM

## 2015-08-19 DIAGNOSIS — R102 Pelvic and perineal pain: Secondary | ICD-10-CM | POA: Insufficient documentation

## 2015-08-19 DIAGNOSIS — O26899 Other specified pregnancy related conditions, unspecified trimester: Secondary | ICD-10-CM

## 2015-08-19 LAB — CBC
HCT: 36.4 % (ref 36.0–46.0)
Hemoglobin: 12.9 g/dL (ref 12.0–15.0)
MCH: 32.7 pg (ref 26.0–34.0)
MCHC: 35.4 g/dL (ref 30.0–36.0)
MCV: 92.2 fL (ref 78.0–100.0)
PLATELETS: 210 10*3/uL (ref 150–400)
RBC: 3.95 MIL/uL (ref 3.87–5.11)
RDW: 13.1 % (ref 11.5–15.5)
WBC: 11.5 10*3/uL — AB (ref 4.0–10.5)

## 2015-08-19 LAB — URINALYSIS, ROUTINE W REFLEX MICROSCOPIC
Bilirubin Urine: NEGATIVE
GLUCOSE, UA: NEGATIVE mg/dL
Hgb urine dipstick: NEGATIVE
Ketones, ur: NEGATIVE mg/dL
LEUKOCYTES UA: NEGATIVE
Nitrite: NEGATIVE
PH: 5.5 (ref 5.0–8.0)
PROTEIN: NEGATIVE mg/dL
SPECIFIC GRAVITY, URINE: 1.025 (ref 1.005–1.030)
Urobilinogen, UA: 0.2 mg/dL (ref 0.0–1.0)

## 2015-08-19 LAB — WET PREP, GENITAL
CLUE CELLS WET PREP: NONE SEEN
TRICH WET PREP: NONE SEEN
YEAST WET PREP: NONE SEEN

## 2015-08-19 LAB — OB RESULTS CONSOLE GC/CHLAMYDIA: Gonorrhea: NEGATIVE

## 2015-08-19 MED ORDER — OXYCODONE-ACETAMINOPHEN 5-325 MG PO TABS
1.0000 | ORAL_TABLET | Freq: Four times a day (QID) | ORAL | Status: DC | PRN
Start: 2015-08-19 — End: 2015-08-19
  Administered 2015-08-19: 1 via ORAL
  Filled 2015-08-19: qty 1

## 2015-08-19 MED ORDER — OXYCODONE-ACETAMINOPHEN 5-325 MG PO TABS
1.0000 | ORAL_TABLET | Freq: Once | ORAL | Status: AC
  Administered 2015-08-19: 1 via ORAL
  Filled 2015-08-19: qty 1

## 2015-08-19 NOTE — MAU Provider Note (Addendum)
History   24 yo, G1P0 at [redacted]w[redacted]d presents to the MAU unannounced.   Complains of pain in her left side that radiates from her back to her front that woke her from her sleep yesterday morning at 0300.  States she was unable to sleep last night from the pain and it hurts more " when I urinate" .    Recent bowel movement this morning.   Denies vomiting. Experiences morning sickness/nausea.   Has a perscription for Diclegis  She has not filled yet.  Has a non-productive cough since Sunday.    Patient Active Problem List   Diagnosis Date Noted  . Scoliosis 07/16/2015  . Hyperthyroidism 07/16/2015  . Asthma 07/16/2015  . Sickle cell trait (HCC) 07/16/2015    Chief Complaint  Patient presents with  . Pain   HPI  OB History    Gravida Para Term Preterm AB TAB SAB Ectopic Multiple Living   1               Past Medical History  Diagnosis Date  . Scoliosis   . Asthma   . Hyperthyroidism     Past Surgical History  Procedure Laterality Date  . No past surgeries      Family History  Problem Relation Age of Onset  . Diabetes Mother     type 2  . Hypothyroidism Mother   . Asthma Father   . Asthma Sister   . Hypothyroidism Sister     Social History  Substance Use Topics  . Smoking status: Never Smoker   . Smokeless tobacco: Never Used  . Alcohol Use: No    Allergies:  Allergies  Allergen Reactions  . Amoxicillin Hives and Nausea And Vomiting    Prescriptions prior to admission  Medication Sig Dispense Refill Last Dose  . Prenatal Vit-Fe Fumarate-FA (PRENATAL MULTIVITAMIN) TABS tablet Take 1 tablet by mouth daily at 12 noon.   08/18/2015 at Unknown time  . albuterol (PROVENTIL HFA;VENTOLIN HFA) 108 (90 BASE) MCG/ACT inhaler Inhale 2 puffs into the lungs every 4 (four) hours as needed for wheezing or shortness of breath. 1 Inhaler 0         ROS Physical Exam   Blood pressure 116/82, pulse 93, temperature 98.6 F (37 C), temperature source Oral, resp. rate 18,  height  (1.575 m), weight 84.369 kg (186 lb), last menstrual period 04/27/2015.      Results for orders placed or performed during the hospital encounter of 08/19/15 (from the past 24 hour(s))  Urinalysis, Routine w reflex microscopic (not at [facility])     Status: None   Collection Time: 08/19/15  7:16 AM  Result Value Ref Range   Color, Urine YELLOW YELLOW   APPearance CLEAR CLEAR   Specific Gravity, Urine 1.025 1.005 - 1.030   pH 5.5 5.0 - 8.0   Glucose, UA NEGATIVE NEGATIVE mg/dL   Hgb urine dipstick NEGATIVE NEGATIVE   Bilirubin Urine NEGATIVE NEGATIVE   Ketones, ur NEGATIVE NEGATIVE mg/dL   Protein, ur NEGATIVE NEGATIVE mg/dL   Urobilinogen, UA 0.2 0.0 - 1.0 mg/dL   Nitrite NEGATIVE NEGATIVE   Leukocytes, UA NEGATIVE NEGATIVE  CBC     Status: Abnormal   Collection Time: 08/19/15  9:40 AM  Result Value Ref Range   WBC 11.5 (H) 4.0 - 10.5 K/uL   RBC 3.95 3.87 - 5.11 MIL/uL   Hemoglobin 12.9 12.0 - 15.0 g/dL   HCT 40.9 81.1 - 91.4 %   MCV 92.2 78.0 -  100.0 fL   MCH 32.7 26.0 - 34.0 pg   MCHC 35.4 30.0 - 36.0 g/dL   RDW 40.913.1 81.111.5 - 91.415.5 %   Platelets 210 150 - 400 K/uL  Wet prep, genital     Status: Abnormal   Collection Time: 08/19/15 11:25 AM  Result Value Ref Range   Yeast Wet Prep HPF POC NONE SEEN NONE SEEN   Trich, Wet Prep NONE SEEN NONE SEEN   Clue Cells Wet Prep HPF POC NONE SEEN NONE SEEN   WBC, Wet Prep HPF POC MODERATE (A) NONE SEEN      Physical Exam  Constitutional: She is oriented to person, place, and time. She appears well-developed and well-nourished.  HENT:  Head: Normocephalic and atraumatic.  Eyes: Pupils are equal, round, and reactive to light.  Neck: Normal range of motion. Neck supple.  Cardiovascular: Normal rate, regular rhythm and normal heart sounds.   Respiratory: Effort normal and breath sounds normal.  GI: Soft. Bowel sounds are normal. There is tenderness in the left upper quadrant and left lower quadrant. There is no CVA  tenderness.    Genitourinary: Left adnexum displays tenderness.  Musculoskeletal: Normal range of motion.  Neurological: She is alert and oriented to person, place, and time. She has normal reflexes.  Skin: Skin is warm and dry.  Psychiatric: She has a normal mood and affect. Her behavior is normal. Judgment and thought content normal.      ED Course  Assessment: 24 yo, G1P0, 277w2d Round ligament pain  Left RUQ/LLQ pain     Plan: Discharge home Tylenol for pain May apply heat to back  No heavy lifting Keep upcoming [facility] appointment on 08/27/15 Call if symptoms worsen   Alphonzo SeveranceRachel Stall CNM, MSN 08/19/2015 3:17 PM  I saw and examined patient at bedside and agree with above findings, assessment and plan.  My exam: Patient in no acute distress.  Cooperative, alert.  Speculum exam: small physiologic discharge, no odor.  GC/chlam done.  No vulva/vaginal lesions. BME: Cervix closed.  No cervical motion tenderness bilaterally.  Abdomen: Soft, tender to palpation in mid left abdomen, no rebound, no guarding.  Back: No CVA tenderness bilaterally. Assessment: Abdominal pain likely secondary to musculoskeletal /round ligament pain, patient with benign exam.  Advised pain meds use prn, she was offered percocet, patient declined.  Pain and infection precautions reviewed. Follow up in office as scheduled or sooner if symptoms worsen.  Dr. Sallye OberKulwa.

## 2015-08-19 NOTE — MAU Note (Addendum)
PT SAYS YESTERDAY   AT 0300-   SHE WAS LYING  DOWN - AWOKE   WITH ABD  PAIN - ON HER  LEFT SIDE.              HURTS  TO WALK          [facility]-  [facility]. SEEN LAST  ON 10-10.       SHE TRIED  TO CALL [facility]- COULD NOT  GET  THROUGH.     WHEN  SHE  VOIDS    HURTS  ON LEFT  SIDE..       SOMETIMES  HURTS  TO COUGH    ON LEFT  SIDE -  STARTED  ON SUNDAY

## 2015-08-19 NOTE — Discharge Instructions (Signed)
Round Ligament Pain  The round ligament is a cord of muscle and tissue that helps to support the uterus. It can become a source of pain during pregnancy if it becomes stretched or twisted as the baby grows. The pain usually begins in the second trimester of pregnancy, and it can come and go until the baby is delivered. It is not a serious problem, and it does not cause harm to the baby.  Round ligament pain is usually a short, sharp, and pinching pain, but it can also be a dull, lingering, and aching pain. The pain is felt in the lower side of the abdomen or in the groin. It usually starts deep in the groin and moves up to the outside of the hip area. Pain can occur with:   A sudden change in position.   Rolling over in bed.   Coughing or sneezing.   Physical activity.  HOME CARE INSTRUCTIONS  Watch your condition for any changes. Take these steps to help with your pain:   When the pain starts, relax. Then try:    Sitting down.    Flexing your knees up to your abdomen.    Lying on your side with one pillow under your abdomen and another pillow between your legs.    Sitting in a warm bath for 15-20 minutes or until the pain goes away.   Take over-the-counter and prescription medicines only as told by your health care provider.   Move slowly when you sit and stand.   Avoid long walks if they cause pain.   Stop or lessen your physical activities if they cause pain.  SEEK MEDICAL CARE IF:   Your pain does not go away with treatment.   You feel pain in your back that you did not have before.   Your medicine is not helping.  SEEK IMMEDIATE MEDICAL CARE IF:   You develop a fever or chills.   You develop uterine contractions.   You develop vaginal bleeding.   You develop nausea or vomiting.   You develop diarrhea.   You have pain when you urinate.     This information is not intended to replace advice given to you by your health care provider. Make sure you discuss any questions you have with your health  care provider.     Document Released: 07/12/2008 Document Revised: 12/26/2011 Document Reviewed: 12/10/2014  Elsevier Interactive Patient Education 2016 [employer]

## 2015-08-19 NOTE — MAU Note (Signed)
Pt reports pain on left side radiating around to her back starting yesterday morning at 0300 and getting progressively worse. Pt couldn't sleep last night from the pain. Pain is worse when she is urinating. Pt also c/o cough starting on Sunday and feeling like she is getting a cold.

## 2015-08-21 LAB — URINE CULTURE

## 2015-08-21 LAB — GC/CHLAMYDIA PROBE AMP (~~LOC~~) NOT AT ARMC
CHLAMYDIA, DNA PROBE: NEGATIVE
Neisseria Gonorrhea: NEGATIVE

## 2015-12-29 LAB — OB RESULTS CONSOLE GBS: GBS: POSITIVE

## 2016-01-06 ENCOUNTER — Encounter (HOSPITAL_COMMUNITY): Payer: Self-pay | Admitting: *Deleted

## 2016-01-06 ENCOUNTER — Inpatient Hospital Stay (HOSPITAL_COMMUNITY)
Admission: AD | Admit: 2016-01-06 | Discharge: 2016-01-06 | Disposition: A | Discharge status: Home / Self Care | Payer: BLUE CROSS/BLUE SHIELD | Source: Ambulatory Visit | Attending: Obstetrics and Gynecology | Admitting: Obstetrics and Gynecology

## 2016-01-06 DIAGNOSIS — O99353 Diseases of the nervous system complicating pregnancy, third trimester: Secondary | ICD-10-CM | POA: Insufficient documentation

## 2016-01-06 DIAGNOSIS — O99283 Endocrine, nutritional and metabolic diseases complicating pregnancy, third trimester: Secondary | ICD-10-CM | POA: Diagnosis not present

## 2016-01-06 DIAGNOSIS — E0590 Thyrotoxicosis, unspecified without thyrotoxic crisis or storm: Secondary | ICD-10-CM | POA: Insufficient documentation

## 2016-01-06 DIAGNOSIS — D573 Sickle-cell trait: Secondary | ICD-10-CM | POA: Insufficient documentation

## 2016-01-06 DIAGNOSIS — B951 Streptococcus, group B, as the cause of diseases classified elsewhere: Secondary | ICD-10-CM | POA: Diagnosis present

## 2016-01-06 DIAGNOSIS — O99613 Diseases of the digestive system complicating pregnancy, third trimester: Secondary | ICD-10-CM | POA: Insufficient documentation

## 2016-01-06 DIAGNOSIS — Z880 Allergy status to penicillin: Secondary | ICD-10-CM | POA: Insufficient documentation

## 2016-01-06 DIAGNOSIS — M419 Scoliosis, unspecified: Secondary | ICD-10-CM | POA: Diagnosis not present

## 2016-01-06 DIAGNOSIS — O26893 Other specified pregnancy related conditions, third trimester: Secondary | ICD-10-CM | POA: Diagnosis present

## 2016-01-06 DIAGNOSIS — Z3A36 36 weeks gestation of pregnancy: Secondary | ICD-10-CM | POA: Diagnosis not present

## 2016-01-06 DIAGNOSIS — J45909 Unspecified asthma, uncomplicated: Secondary | ICD-10-CM | POA: Diagnosis not present

## 2016-01-06 DIAGNOSIS — O99820 Streptococcus B carrier state complicating pregnancy: Secondary | ICD-10-CM | POA: Insufficient documentation

## 2016-01-06 DIAGNOSIS — O99013 Anemia complicating pregnancy, third trimester: Secondary | ICD-10-CM | POA: Diagnosis not present

## 2016-01-06 LAB — WET PREP, GENITAL
CLUE CELLS WET PREP: NONE SEEN
Sperm: NONE SEEN
TRICH WET PREP: NONE SEEN

## 2016-01-06 LAB — AMNISURE RUPTURE OF MEMBRANE (ROM) NOT AT ARMC: Amnisure ROM: NEGATIVE

## 2016-01-06 NOTE — MAU Note (Signed)
Urine in lab 

## 2016-01-06 NOTE — MAU Provider Note (Signed)
History   25 yo G1P0 at 7636 4/7 weeks presented unannounced c/o "gush of fluid" at 0615 upon awakening and ambulating to BR.  Denies further leaking, reports mild cramping and positive FM.  Patient Active Problem List   Diagnosis Date Noted  . 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 Allergy to amoxicillin 01/06/2016  . Scoliosis 07/16/2015  . Hyperthyroidism 07/16/2015  . Asthma 07/16/2015  . Sickle cell trait (HCC) 07/16/2015  Thyroid testing WNL during pregnancy  Chief Complaint  Patient presents with  . Rupture of Membranes   HPI:  See above  OB History    Gravida Para Term Preterm AB TAB SAB Ectopic Multiple Living   1               Past Medical History  Diagnosis Date  . Scoliosis   . Asthma   . Hyperthyroidism   . Scoliosis     Past Surgical History  Procedure Laterality Date  . No past surgeries      Family History  Problem Relation Age of Onset  . Diabetes Mother     type 2  . Hypothyroidism Mother   . Asthma Father   . Asthma Sister   . Hypothyroidism Sister     Social History  Substance Use Topics  . Smoking status: Never Smoker   . Smokeless tobacco: Never Used  . Alcohol Use: No    Allergies:  Allergies  Allergen Reactions  . Amoxicillin Hives and Nausea And Vomiting    Has patient had a PCN reaction causing immediate rash, facial/tongue/throat swelling, SOB or lightheadedness with hypotension: Yes Has patient had a PCN reaction causing severe rash involving mucus membranes or skin necrosis: No Has patient had a PCN reaction that required hospitalization No Has patient had a PCN reaction occurring within the last 10 years: No If all of the above answers are "NO", then may proceed with Cephalosporin use.     Prescriptions prior to admission  Medication Sig Dispense Refill Last Dose  . acetaminophen (TYLENOL) 500 MG tablet Take 500 mg by mouth every 6 (six) hours as needed for mild pain.   Past Week at  Unknown time  . albuterol (PROVENTIL HFA;VENTOLIN HFA) 108 (90 BASE) MCG/ACT inhaler Inhale 2 puffs into the lungs every 4 (four) hours as needed for wheezing or shortness of breath. 1 Inhaler 0 Past Month at Unknown time  . calcium carbonate (TUMS - DOSED IN MG ELEMENTAL CALCIUM) 500 MG chewable tablet Chew 3 tablets by mouth daily as needed for indigestion or heartburn.   01/05/2016 at Unknown time  . Prenatal Vit-Min-FA-Fish Oil ([employer] PRENATAL GUMMY PO) Take 2 tablets by mouth daily.   Past Week at Unknown time    ROS:  "Gush of fluid" at 0615, mild cramping Physical Exam   Blood pressure 122/80, pulse 108, temperature 98.3 F (36.8 C), temperature source Oral, resp. rate 18, height 5\' 1"  (1.549 m), weight 91.627 kg (202 lb), last menstrual period 04/27/2015.    Physical Exam  In NAD Chest clear Heart RRR without murmur Abd gravid, NT Pelvic--small amount creamy d/c in vault, no evidence pooling.  Cervix posterior, closed, soft, 50%, vtx, -1. Ext WNL  FHR Category 1 UCs mild, occasional  Fern negative  ED Course  Assessment: IUP at 36 4/7 weeks ? Episode of leaking GBS positive No evidence labor  Plan: Wet prep Amnisure   Nigel BridgemanLATHAM, Michaiah Maiden CNM, MSN 01/06/2016 9:29 AM   Addendum: Results for orders  placed or performed during the hospital encounter of 01/06/16 (from the past 24 hour(s))  Amnisure rupture of membrane (rom)not at [facility]     Status: None   Collection Time: 01/06/16  9:10 AM  Result Value Ref Range   Amnisure ROM NEGATIVE   Wet prep, genital     Status: Abnormal   Collection Time: 01/06/16  9:10 AM  Result Value Ref Range   Yeast Wet Prep HPF POC PRESENT (A) NONE SEEN   Trich, Wet Prep NONE SEEN NONE SEEN   Clue Cells Wet Prep HPF POC NONE SEEN NONE SEEN   WBC, Wet Prep HPF POC TOO NUMEROUS TO COUNT (A) NONE SEEN   Sperm NONE SEEN    No evidence ROM or labor. Category 1 FHR. Minimal UCs  Plan: D/C home with labor precautions. Keep scheduled ROB  visit this week. F/u with any questions or concerns.  Nigel Bridgeman, CNM 01/06/16 10a

## 2016-01-06 NOTE — MAU Note (Addendum)
Pt states getting ready for work this morning at [phone] and felt a gush of clear, odorless fluid.  Pt states has not been leaking since that initial gush.

## 2016-01-06 NOTE — Discharge Instructions (Signed)
Braxton Hicks Contractions °Contractions of the uterus can occur throughout pregnancy. Contractions are not always a sign that you are in labor.  °WHAT ARE BRAXTON HICKS CONTRACTIONS?  °Contractions that occur before labor are called Braxton Hicks contractions, or false labor. Toward the end of pregnancy (32-34 weeks), these contractions can develop more often and may become more forceful. This is not true labor because these contractions do not result in opening (dilatation) and thinning of the cervix. They are sometimes difficult to tell apart from true labor because these contractions can be forceful and people have different pain tolerances. You should not feel embarrassed if you go to the hospital with false labor. Sometimes, the only way to tell if you are in true labor is for your health care provider to look for changes in the cervix. °If there are no prenatal problems or other health problems associated with the pregnancy, it is completely safe to be sent home with false labor and await the onset of true labor. °HOW CAN YOU TELL THE DIFFERENCE BETWEEN TRUE AND FALSE LABOR? °False Labor °· The contractions of false labor are usually shorter and not as hard as those of true labor.   °· The contractions are usually irregular.   °· The contractions are often felt in the front of the lower abdomen and in the groin.   °· The contractions may go away when you walk around or change positions while lying down.   °· The contractions get weaker and are shorter lasting as time goes on.   °· The contractions do not usually become progressively stronger, regular, and closer together as with true labor.   °True Labor °· Contractions in true labor last 30-70 seconds, become very regular, usually become more intense, and increase in frequency.   °· The contractions do not go away with walking.   °· The discomfort is usually felt in the top of the uterus and spreads to the lower abdomen and low back.   °· True labor can be  determined by your health care provider with an exam. This will show that the cervix is dilating and getting thinner.   °WHAT TO REMEMBER °· Keep up with your usual exercises and follow other instructions given by your health care provider.   °· Take medicines as directed by your health care provider.   °· Keep your regular prenatal appointments.   °· Eat and drink lightly if you think you are going into labor.   °· If Braxton Hicks contractions are making you uncomfortable:   °¨ Change your position from lying down or resting to walking, or from walking to resting.   °¨ Sit and rest in a tub of warm water.   °¨ Drink 2-3 glasses of water. Dehydration may cause these contractions.   °¨ Do slow and deep breathing several times an hour.   °WHEN SHOULD I SEEK IMMEDIATE MEDICAL CARE? °Seek immediate medical care if: °· Your contractions become stronger, more regular, and closer together.   °· You have fluid leaking or gushing from your vagina.   °· You have a fever.   °· You pass blood-tinged mucus.   °· You have vaginal bleeding.   °· You have continuous abdominal pain.   °· You have low back pain that you never had before.   °· You feel your baby's head pushing down and causing pelvic pressure.   °· Your baby is not moving as much as it used to.   °  °This information is not intended to replace advice given to you by your health care provider. Make sure you discuss any questions you have with your health care   provider. °  °Document Released: 10/03/2005 Document Revised: 10/08/2013 Document Reviewed: 07/15/2013 °Elsevier Interactive Patient Education ©2016 [employer] ° °

## 2016-01-28 ENCOUNTER — Inpatient Hospital Stay (HOSPITAL_COMMUNITY): Payer: BLUE CROSS/BLUE SHIELD | Admitting: Anesthesiology

## 2016-01-28 ENCOUNTER — Encounter (HOSPITAL_COMMUNITY): Payer: Self-pay | Admitting: *Deleted

## 2016-01-28 ENCOUNTER — Inpatient Hospital Stay (HOSPITAL_COMMUNITY)
Admission: AD | Admit: 2016-01-28 | Discharge: 2016-02-01 | DRG: 766 | Disposition: A | Discharge status: Home / Self Care | Payer: BLUE CROSS/BLUE SHIELD | Source: Ambulatory Visit | Attending: Obstetrics and Gynecology | Admitting: Obstetrics and Gynecology

## 2016-01-28 DIAGNOSIS — O99824 Streptococcus B carrier state complicating childbirth: Secondary | ICD-10-CM | POA: Diagnosis present

## 2016-01-28 DIAGNOSIS — D649 Anemia, unspecified: Secondary | ICD-10-CM | POA: Diagnosis not present

## 2016-01-28 DIAGNOSIS — Z3A39 39 weeks gestation of pregnancy: Secondary | ICD-10-CM

## 2016-01-28 DIAGNOSIS — M419 Scoliosis, unspecified: Secondary | ICD-10-CM | POA: Diagnosis present

## 2016-01-28 DIAGNOSIS — Z825 Family history of asthma and other chronic lower respiratory diseases: Secondary | ICD-10-CM | POA: Diagnosis not present

## 2016-01-28 DIAGNOSIS — D573 Sickle-cell trait: Secondary | ICD-10-CM | POA: Diagnosis present

## 2016-01-28 DIAGNOSIS — O4290 Premature rupture of membranes, unspecified as to length of time between rupture and onset of labor, unspecified weeks of gestation: Secondary | ICD-10-CM

## 2016-01-28 DIAGNOSIS — Z880 Allergy status to penicillin: Secondary | ICD-10-CM | POA: Diagnosis not present

## 2016-01-28 DIAGNOSIS — O3663X0 Maternal care for excessive fetal growth, third trimester, not applicable or unspecified: Secondary | ICD-10-CM | POA: Diagnosis present

## 2016-01-28 DIAGNOSIS — O9989 Other specified diseases and conditions complicating pregnancy, childbirth and the puerperium: Secondary | ICD-10-CM | POA: Diagnosis present

## 2016-01-28 DIAGNOSIS — O339 Maternal care for disproportion, unspecified: Secondary | ICD-10-CM | POA: Diagnosis present

## 2016-01-28 DIAGNOSIS — O9952 Diseases of the respiratory system complicating childbirth: Secondary | ICD-10-CM | POA: Diagnosis present

## 2016-01-28 DIAGNOSIS — Z833 Family history of diabetes mellitus: Secondary | ICD-10-CM

## 2016-01-28 DIAGNOSIS — O4202 Full-term premature rupture of membranes, onset of labor within 24 hours of rupture: Secondary | ICD-10-CM | POA: Diagnosis present

## 2016-01-28 DIAGNOSIS — J45909 Unspecified asthma, uncomplicated: Secondary | ICD-10-CM | POA: Diagnosis present

## 2016-01-28 DIAGNOSIS — O9081 Anemia of the puerperium: Secondary | ICD-10-CM | POA: Diagnosis not present

## 2016-01-28 DIAGNOSIS — Z98891 History of uterine scar from previous surgery: Secondary | ICD-10-CM

## 2016-01-28 LAB — CBC
HCT: 34.3 % — ABNORMAL LOW (ref 36.0–46.0)
Hemoglobin: 11.9 g/dL — ABNORMAL LOW (ref 12.0–15.0)
MCH: 29.7 pg (ref 26.0–34.0)
MCHC: 34.7 g/dL (ref 30.0–36.0)
MCV: 85.5 fL (ref 78.0–100.0)
PLATELETS: 278 10*3/uL (ref 150–400)
RBC: 4.01 MIL/uL (ref 3.87–5.11)
RDW: 15.3 % (ref 11.5–15.5)
WBC: 14.8 10*3/uL — AB (ref 4.0–10.5)

## 2016-01-28 LAB — TYPE AND SCREEN
ABO/RH(D): A POS
Antibody Screen: NEGATIVE

## 2016-01-28 LAB — ABO/RH: ABO/RH(D): A POS

## 2016-01-28 LAB — POCT FERN TEST

## 2016-01-28 MED ORDER — LIDOCAINE HCL (PF) 1 % IJ SOLN
INTRAMUSCULAR | Status: DC | PRN
  Administered 2016-01-28 (×2): 4 mL

## 2016-01-28 MED ORDER — VANCOMYCIN HCL IN DEXTROSE 1-5 GM/200ML-% IV SOLN
1000.0000 mg | Freq: Two times a day (BID) | INTRAVENOUS | Status: DC
  Administered 2016-01-28 – 2016-01-29 (×3): 1000 mg via INTRAVENOUS
  Filled 2016-01-28 (×3): qty 200

## 2016-01-28 MED ORDER — LACTATED RINGERS IV SOLN
INTRAVENOUS | Status: DC
  Administered 2016-01-28 – 2016-01-29 (×5): via INTRAVENOUS

## 2016-01-28 MED ORDER — PHENYLEPHRINE 40 MCG/ML (10ML) SYRINGE FOR IV PUSH (FOR BLOOD PRESSURE SUPPORT)
80.0000 ug | PREFILLED_SYRINGE | INTRAVENOUS | Status: DC | PRN
  Filled 2016-01-28: qty 20

## 2016-01-28 MED ORDER — LACTATED RINGERS IV SOLN
500.0000 mL | Freq: Once | INTRAVENOUS | Status: AC
  Administered 2016-01-28: 500 mL via INTRAVENOUS

## 2016-01-28 MED ORDER — ONDANSETRON HCL 4 MG/2ML IJ SOLN: 4.0000 mg | Freq: Four times a day (QID) | INTRAMUSCULAR | Status: DC | PRN

## 2016-01-28 MED ORDER — OXYCODONE-ACETAMINOPHEN 5-325 MG PO TABS: 2.0000 | ORAL_TABLET | ORAL | Status: DC | PRN

## 2016-01-28 MED ORDER — MISOPROSTOL 50MCG HALF TABLET
50.0000 ug | ORAL_TABLET | ORAL | Status: DC | PRN
  Administered 2016-01-28: 50 ug via ORAL
  Filled 2016-01-28: qty 0.5

## 2016-01-28 MED ORDER — OXYTOCIN BOLUS FROM INFUSION: 500.0000 mL | INTRAVENOUS | Status: DC

## 2016-01-28 MED ORDER — DIPHENHYDRAMINE HCL 50 MG/ML IJ SOLN: 12.5000 mg | INTRAMUSCULAR | Status: DC | PRN

## 2016-01-28 MED ORDER — PHENYLEPHRINE 40 MCG/ML (10ML) SYRINGE FOR IV PUSH (FOR BLOOD PRESSURE SUPPORT): 80.0000 ug | PREFILLED_SYRINGE | INTRAVENOUS | Status: DC | PRN

## 2016-01-28 MED ORDER — MISOPROSTOL 25 MCG QUARTER TABLET: 25.0000 ug | ORAL_TABLET | ORAL | Status: DC | PRN

## 2016-01-28 MED ORDER — TERBUTALINE SULFATE 1 MG/ML IJ SOLN: 0.2500 mg | Freq: Once | INTRAMUSCULAR | Status: DC | PRN

## 2016-01-28 MED ORDER — ACETAMINOPHEN 325 MG PO TABS
650.0000 mg | ORAL_TABLET | ORAL | Status: DC | PRN
  Administered 2016-01-28: 650 mg via ORAL
  Filled 2016-01-28: qty 2

## 2016-01-28 MED ORDER — ALBUTEROL SULFATE (2.5 MG/3ML) 0.083% IN NEBU: 3.0000 mL | INHALATION_SOLUTION | RESPIRATORY_TRACT | Status: DC | PRN

## 2016-01-28 MED ORDER — EPHEDRINE 5 MG/ML INJ: 10.0000 mg | INTRAVENOUS | Status: DC | PRN

## 2016-01-28 MED ORDER — FENTANYL 2.5 MCG/ML BUPIVACAINE 1/10 % EPIDURAL INFUSION (WH - ANES)
14.0000 mL/h | INTRAMUSCULAR | Status: DC | PRN
  Administered 2016-01-28 – 2016-01-29 (×2): 14 mL/h via EPIDURAL
  Filled 2016-01-28 (×2): qty 125

## 2016-01-28 MED ORDER — LIDOCAINE HCL (PF) 1 % IJ SOLN: 30.0000 mL | INTRAMUSCULAR | Status: DC | PRN

## 2016-01-28 MED ORDER — OXYCODONE-ACETAMINOPHEN 5-325 MG PO TABS: 1.0000 | ORAL_TABLET | ORAL | Status: DC | PRN

## 2016-01-28 MED ORDER — OXYTOCIN 10 UNIT/ML IJ SOLN
2.5000 [IU]/h | INTRAVENOUS | Status: DC
  Filled 2016-01-28: qty 4

## 2016-01-28 MED ORDER — CITRIC ACID-SODIUM CITRATE 334-500 MG/5ML PO SOLN
30.0000 mL | ORAL | Status: DC | PRN
  Administered 2016-01-29: 30 mL via ORAL
  Filled 2016-01-28: qty 15

## 2016-01-28 MED ORDER — FENTANYL 2.5 MCG/ML BUPIVACAINE 1/10 % EPIDURAL INFUSION (WH - ANES): 14.0000 mL/h | INTRAMUSCULAR | Status: DC | PRN

## 2016-01-28 MED ORDER — LACTATED RINGERS IV SOLN
500.0000 mL | INTRAVENOUS | Status: DC | PRN
  Administered 2016-01-29: 500 mL via INTRAVENOUS

## 2016-01-28 MED ORDER — OXYTOCIN 10 UNIT/ML IJ SOLN
1.0000 m[IU]/min | INTRAMUSCULAR | Status: DC
  Administered 2016-01-28: 2 m[IU]/min via INTRAVENOUS

## 2016-01-28 MED ORDER — NALBUPHINE HCL 10 MG/ML IJ SOLN
5.0000 mg | INTRAMUSCULAR | Status: DC | PRN
  Administered 2016-01-28: 5 mg via INTRAVENOUS
  Filled 2016-01-28: qty 1

## 2016-01-28 NOTE — Anesthesia Preprocedure Evaluation (Signed)
Anesthesia Evaluation  Patient identified by MRN, date of birth, ID band Patient awake    Reviewed: Allergy & Precautions, NPO status , Patient's Chart, lab work & pertinent test results  Airway Mallampati: II  TM Distance: >3 FB Neck ROM: Full    Dental no notable dental hx.    Pulmonary asthma ,    Pulmonary exam normal breath sounds clear to auscultation       Cardiovascular negative cardio ROS Normal cardiovascular exam Rhythm:Regular Rate:Normal     Neuro/Psych negative neurological ROS  negative psych ROS   GI/Hepatic negative GI ROS, Neg liver ROS,   Endo/Other  Hyperthyroidism   Renal/GU negative Renal ROS     Musculoskeletal negative musculoskeletal ROS (+)   Abdominal (+) + obese,   Peds  Hematology negative hematology ROS (+)   Anesthesia Other Findings   Reproductive/Obstetrics negative OB ROS (+) Pregnancy                             Anesthesia Physical Anesthesia Plan  ASA: III  Anesthesia Plan: Epidural   Post-op Pain Management:    Induction:   Airway Management Planned:   Additional Equipment:   Intra-op Plan:   Post-operative Plan:   Informed Consent: I have reviewed the patients History and Physical, chart, labs and discussed the procedure including the risks, benefits and alternatives for the proposed anesthesia with the patient or authorized representative who has indicated his/her understanding and acceptance.     Plan Discussed with:   Anesthesia Plan Comments: (Pt with know scoliosis. Informed that epidural may be difficult to place. Pt accepts risks.)        Anesthesia Quick Evaluation

## 2016-01-28 NOTE — Progress Notes (Addendum)
Cleared by anesthesia for epidural.  Subjective: No complaints.  Objective: BP 96/54 mmHg  Pulse 87  Temp(Src) 98.2 F (36.8 C) (Oral)  Resp 18  Ht 5\' 1"  (1.549 m)  Wt 92.987 kg (205 lb)  BMI 38.75 kg/m2  SpO2 97%  LMP 04/27/2015 (Approximate) I/O last 3 completed shifts: In: 156.3 [I.V.:156.3] Out: -  Today's Vitals   01/28/16 2355 01/28/16 2356 01/29/16 0000 01/29/16 0001  BP:  96/54  94/53  Pulse: 88 87 87 87  Temp:      TempSrc:      Resp:  18  18  Height:      Weight:      SpO2: 97%  97%   PainSc:    0-No pain    FHT: BL 135 w/ moderate variability, +accels, mild variables, no lates. Of note, FHR 160s-170s around 8:30 pm, then 130s after fluid bolus. UC:   irregular, every 2-3 minutes SVE:   Dilation: 3 Effacement (%): 50 Station: -3 Exam by:: B Boyer RN @ [phone] Pitocin at 2 mU/min Max temp 98.9  Assessment:  SROM x 16 1/2 hrs; no concerns for infection H/O Scoliosis H/O Asthma - on Proventil HFA H/O Hyperthyroidism Amoxicillin allergy GBS positive SCT Latent labor  Plan: Continue Pitocin augmentation. Dr. Normand Sloopillard updated.  Sherre Compton, Debra Peets CNM 01/28/2016, 11:57 PM

## 2016-01-28 NOTE — MAU Note (Signed)
Pt stated she thinks her water broke. C/o some ctx not sure how far apart.

## 2016-01-28 NOTE — Anesthesia Procedure Notes (Signed)
Epidural Patient location during procedure: OB  Staffing Anesthesiologist: Seairra Otani Performed by: anesthesiologist   Preanesthetic Checklist Completed: patient identified, pre-op evaluation, timeout performed, IV checked, risks and benefits discussed and monitors and equipment checked  Epidural Patient position: sitting Prep: site prepped and draped and DuraPrep Patient monitoring: heart rate Approach: midline Location: L3-L4 Injection technique: LOR air and LOR saline  Needle:  Needle type: Tuohy  Needle gauge: 17 G Needle length: 9 cm Needle insertion depth: 8 cm Catheter type: closed end flexible Catheter size: 19 Gauge Catheter at skin depth: 14 cm Test dose: negative  Assessment Sensory level: T8 Events: blood not aspirated, injection not painful, no injection resistance, negative IV test and no paresthesia  Additional Notes Reason for block:procedure for pain   

## 2016-01-28 NOTE — H&P (Signed)
Debra SchlatterBriana A Compton is a 25 y.o. female, G1 P0 at 39.5 weeks present to MAU c/o LOF  Patient Active Problem List   Diagnosis Date Noted  . 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 Allergy to amoxicillin 01/06/2016  . Scoliosis 07/16/2015  . Hyperthyroidism 07/16/2015  . Asthma 07/16/2015  . Sickle cell trait (HCC) 07/16/2015    Pregnancy Course: Patient entered care at 10.0 weeks.   EDC of 01/30/16 was established by US.      US evaluations:  11.5 weeks - Dating: SIUP, 11 5/7 weeks, EDC 01/30/16, left ovary WNL, unable to see right. 20.6 weeks - Anatomy:  FHR 143, cervical length 3.84, vertex, posterior placenta, female, 3vv, LVOT spine not seen,     24.5 weeks - FU:  EFW 1lb 11oz, cervical length 4cm,  FHR 145, vertex, posterior placenta, LVOT #VV and spine seen  Significant prenatal events:   See above   Last evaluation:   38.0 weeks   VE:0/50/-3  Reason for admission:  PROM  Pt States:   Contractions Frequency: none         Contraction severity: n/a         Fetal activity: +FM  OB History    Gravida Para Term Preterm AB TAB SAB Ectopic Multiple Living   1              Past Medical History  Diagnosis Date  . Scoliosis   . Asthma   . Hyperthyroidism   . Scoliosis    Past Surgical History  Procedure Laterality Date  . No past surgeries     Family History: family history includes Asthma in her father and sister; Diabetes in her mother; Hypothyroidism in her mother and sister. Social History:  reports that she has never smoked. She has never used smokeless tobacco. She reports that she does not drink alcohol or use illicit drugs.   Prenatal Transfer Tool  Maternal Diabetes: No Genetic Screening: Normal Maternal Ultrasounds/Referrals: Normal Fetal Ultrasounds or other Referrals:  None Maternal Substance Abuse:  No Significant Maternal Medications:  None Significant Maternal Lab Results: Lab values include: Group B Strep positive   ROS:   See HPI above, all other systems are negative  Allergies  Allergen Reactions  . Amoxicillin Hives and Nausea And Vomiting    Has patient had a PCN reaction causing immediate rash, facial/tongue/throat swelling, SOB or lightheadedness with hypotension: Yes Has patient had a PCN reaction causing severe rash involving mucus membranes or skin necrosis: No Has patient had a PCN reaction that required hospitalization No Has patient had a PCN reaction occurring within the last 10 years: No If all of the above answers are "NO", then may proceed with Cephalosporin use.       Blood pressure 138/83, pulse 94, temperature 98.1 F (36.7 C), temperature source Oral, last menstrual period 04/27/2015.  Maternal Exam:  Uterine Assessment: Contraction frequency is rare.  Abdomen: Gravid, non tender. Fundal height is aga.  Normal external genitalia, vulva, cervix, uterus and adnexa.  No lesions noted on exam.  Pelvis adequate for delivery.  Fetal presentation: Vertex by VE  Fetal Exam:  Monitor Surveillance :  Intermitting Monitoring  Mode: Ultrasound.  NICHD: Category 1 CTXs: occasional EFW   7.5 lbs  Physical Exam: Nursing note and vitals reviewed General: alert and cooperative She appears well nourished Psychiatric: Normal mood and affect. Her behavior is normal Head: Normocephalic Eyes: Pupils are equal, round, and reactive to light Neck: Normal  range of motion Cardiovascular: RRR without murmur  Respiratory: CTAB. Effort normal  Abd: soft, non-tender, +BS, no rebound, no guarding  Genitourinary: Vagina normal  Neurological: A&Ox3 Skin: Warm and dry  Musculoskeletal: Normal range of motion  Homan's sign negative bilaterally No evidence of DVTs.  Edema: Minimal bilaterally non-pitting edema DTR: 2+ Clonus: None   Prenatal labs: ABO, Rh:  A positive Antibody:  negative Rubella:  immune RPR:   NR HBsAg:   neg HIV:   NR GBS:  positive Sickle cell/Hgb electrophoresis:   WNL Pap:  wnl 12/29/15 GC: neg    Chlamydia: neg Genetic screenings:   neg Glucola:  92   Assessment:  IUP at 39.5 weeks NICHD: Category Membranes: SROM x 4hrs GBS positive Scoliosis    Plan:  Admit to L&D for expectant management of labor. Possible augmentation options reviewed including foley bulb, AROM and/or pitocin.  GBS prophylaxis with Vancomycin 1g q12hr IV pain medication per orders PRN Epidural per patient request Nitrous per patient request Foley cath after patient is comfortable with epidural Anticipate SVD Labor mgmt as ordered Okay to ambulate around unit with wireless monitors  Okay to get up and shower without monitoring     Attending MD available at all times.  Devondre Guzzetta, CNM, MSN 01/28/2016, 9:51 AM

## 2016-01-28 NOTE — Progress Notes (Addendum)
Assuming care of 25 yo [name], G1P0 @ 39.5 wks admitted for SROM. FOB at bedside.   Subjective: C/O pain 7/10 - desires IV pain medication. Awaiting anesthesia consultation due to h/o scoliosis. Endorses FM. Denies VB, chills or fever. Continues to leak clear fluid.   Objective: BP 132/88 mmHg  Pulse 84  Temp(Src) 98.9 F (37.2 C) (Oral)  Resp 16  Ht 5\' 1"  (1.549 m)  Wt 92.987 kg (205 lb)  BMI 38.75 kg/m2  LMP 04/27/2015 (Approximate) I/O last 3 completed shifts: In: 156.3 [I.V.:156.3] Out: -  Today's Vitals   01/28/16 1719 01/28/16 1900 01/28/16 1955 01/28/16 2031  BP: 132/88     Pulse: 84     Temp: 97.9 F (36.6 C) 98.9 F (37.2 C)    TempSrc: Oral Oral    Resp: 16     Height:      Weight:      PainSc:  7  7  7      FHT: BL 150 w/ moderate variability, +accels, mild variables, no lates -- period of increased FHR in the 160s in the last hour - max temp 98.9 UC:   irregular, every 1-3 minutes SVE:   Dilation: Fingertip Effacement (%): Thick Exam by:: V Standard CNM @ 1520 Received Cytotec # 1 po at 1549 Received Vancomycin at 1111  Assessment:  SROM x 13 1/2 hrs - no concerns for infection Overall reasurring FHRT GBS positive - allergic to Amoxicillin and resistant to other meds used for GBS positive status  Plan: CTO closely IVF bolus IV pain medication Await anesthesia's recs Re-examine cvx when ctxs begin to space out. If less than 3 cm, will give another Cytotec po. If greater than 3 cm, will begin Pitocin. Pt in agreement.  Sherre Compton, Debra Illescas CNM 01/28/2016, 9:20 PM

## 2016-01-29 ENCOUNTER — Encounter (HOSPITAL_COMMUNITY): Payer: Self-pay | Admitting: *Deleted

## 2016-01-29 ENCOUNTER — Encounter (HOSPITAL_COMMUNITY)
Admission: AD | Disposition: A | Discharge status: Home / Self Care | Payer: Self-pay | Source: Ambulatory Visit | Attending: Obstetrics and Gynecology

## 2016-01-29 LAB — RPR: RPR: NONREACTIVE

## 2016-01-29 SURGERY — Surgical Case
Anesthesia: Regional

## 2016-01-29 MED ORDER — ZOLPIDEM TARTRATE 5 MG PO TABS: 5.0000 mg | ORAL_TABLET | Freq: Every evening | ORAL | Status: DC | PRN

## 2016-01-29 MED ORDER — DEXAMETHASONE SODIUM PHOSPHATE 4 MG/ML IJ SOLN
INTRAMUSCULAR | Status: DC | PRN
  Administered 2016-01-29: 4 mg via INTRAVENOUS

## 2016-01-29 MED ORDER — LACTATED RINGERS IV SOLN
40.0000 [IU] | INTRAVENOUS | Status: DC | PRN
  Administered 2016-01-29: 40 [IU] via INTRAVENOUS

## 2016-01-29 MED ORDER — SIMETHICONE 80 MG PO CHEW
80.0000 mg | CHEWABLE_TABLET | Freq: Three times a day (TID) | ORAL | Status: DC
  Administered 2016-01-30 – 2016-02-01 (×9): 80 mg via ORAL
  Filled 2016-01-29 (×8): qty 1

## 2016-01-29 MED ORDER — NALBUPHINE HCL 10 MG/ML IJ SOLN: 5.0000 mg | INTRAMUSCULAR | Status: DC | PRN

## 2016-01-29 MED ORDER — NALOXONE HCL 0.4 MG/ML IJ SOLN: 0.4000 mg | INTRAMUSCULAR | Status: DC | PRN

## 2016-01-29 MED ORDER — SODIUM BICARBONATE 8.4 % IV SOLN
INTRAVENOUS | Status: AC
  Filled 2016-01-29: qty 50

## 2016-01-29 MED ORDER — OXYCODONE-ACETAMINOPHEN 5-325 MG PO TABS
1.0000 | ORAL_TABLET | ORAL | Status: DC | PRN
  Administered 2016-01-30: 1 via ORAL
  Filled 2016-01-29: qty 1

## 2016-01-29 MED ORDER — COCONUT OIL OIL
1.0000 "application " | TOPICAL_OIL | Status: DC | PRN
  Administered 2016-01-31: 1 via TOPICAL
  Filled 2016-01-29: qty 120

## 2016-01-29 MED ORDER — ACETAMINOPHEN 325 MG PO TABS: 650.0000 mg | ORAL_TABLET | ORAL | Status: DC | PRN

## 2016-01-29 MED ORDER — KETOROLAC TROMETHAMINE 30 MG/ML IJ SOLN
INTRAMUSCULAR | Status: AC
  Filled 2016-01-29: qty 1

## 2016-01-29 MED ORDER — MENTHOL 3 MG MT LOZG: 1.0000 | LOZENGE | OROMUCOSAL | Status: DC | PRN

## 2016-01-29 MED ORDER — LACTATED RINGERS IV SOLN
INTRAVENOUS | Status: DC
Start: 2016-01-29 — End: 2016-01-29

## 2016-01-29 MED ORDER — MEPERIDINE HCL 25 MG/ML IJ SOLN: 6.2500 mg | INTRAMUSCULAR | Status: DC | PRN

## 2016-01-29 MED ORDER — LIDOCAINE-EPINEPHRINE (PF) 2 %-1:200000 IJ SOLN
INTRAMUSCULAR | Status: AC
  Filled 2016-01-29: qty 20

## 2016-01-29 MED ORDER — LACTATED RINGERS IV SOLN: 2.5000 [IU]/h | INTRAVENOUS | Status: AC

## 2016-01-29 MED ORDER — ONDANSETRON HCL 4 MG/2ML IJ SOLN
INTRAMUSCULAR | Status: DC | PRN
  Administered 2016-01-29: 4 mg via INTRAVENOUS

## 2016-01-29 MED ORDER — GENTAMICIN SULFATE 40 MG/ML IJ SOLN
Freq: Once | INTRAVENOUS | Status: DC
  Filled 2016-01-29: qty 8.25

## 2016-01-29 MED ORDER — SCOPOLAMINE 1 MG/3DAYS TD PT72: 1.0000 | MEDICATED_PATCH | Freq: Once | TRANSDERMAL | Status: DC

## 2016-01-29 MED ORDER — KETOROLAC TROMETHAMINE 30 MG/ML IJ SOLN
30.0000 mg | Freq: Four times a day (QID) | INTRAMUSCULAR | Status: DC | PRN
  Administered 2016-01-29: 30 mg via INTRAMUSCULAR

## 2016-01-29 MED ORDER — OXYTOCIN 10 UNIT/ML IJ SOLN: 40.0000 [IU] | INTRAVENOUS | Status: DC | PRN

## 2016-01-29 MED ORDER — DIBUCAINE 1 % RE OINT: 1.0000 "application " | TOPICAL_OINTMENT | RECTAL | Status: DC | PRN

## 2016-01-29 MED ORDER — DIPHENHYDRAMINE HCL 25 MG PO CAPS
25.0000 mg | ORAL_CAPSULE | Freq: Four times a day (QID) | ORAL | Status: DC | PRN
  Administered 2016-01-30: 25 mg via ORAL
  Filled 2016-01-29: qty 1

## 2016-01-29 MED ORDER — MORPHINE SULFATE (PF) 0.5 MG/ML IJ SOLN
INTRAMUSCULAR | Status: AC
  Filled 2016-01-29: qty 10

## 2016-01-29 MED ORDER — SCOPOLAMINE 1 MG/3DAYS TD PT72
MEDICATED_PATCH | TRANSDERMAL | Status: DC | PRN
  Administered 2016-01-29: 1 via TRANSDERMAL

## 2016-01-29 MED ORDER — SCOPOLAMINE 1 MG/3DAYS TD PT72
MEDICATED_PATCH | TRANSDERMAL | Status: AC
  Filled 2016-01-29: qty 1

## 2016-01-29 MED ORDER — NALOXONE HCL 2 MG/2ML IJ SOSY
1.0000 ug/kg/h | PREFILLED_SYRINGE | INTRAVENOUS | Status: DC | PRN
  Filled 2016-01-29: qty 2

## 2016-01-29 MED ORDER — DEXTROSE 5 % IV SOLN
INTRAVENOUS | Status: DC | PRN
  Administered 2016-01-29: 100 mL via INTRAVENOUS

## 2016-01-29 MED ORDER — NALBUPHINE HCL 10 MG/ML IJ SOLN: 5.0000 mg | Freq: Once | INTRAMUSCULAR | Status: DC | PRN

## 2016-01-29 MED ORDER — FENTANYL CITRATE (PF) 100 MCG/2ML IJ SOLN: 25.0000 ug | INTRAMUSCULAR | Status: DC | PRN

## 2016-01-29 MED ORDER — KETOROLAC TROMETHAMINE 30 MG/ML IJ SOLN: 30.0000 mg | Freq: Four times a day (QID) | INTRAMUSCULAR | Status: DC | PRN

## 2016-01-29 MED ORDER — METHYLERGONOVINE MALEATE 0.2 MG/ML IJ SOLN: 0.2000 mg | INTRAMUSCULAR | Status: DC | PRN

## 2016-01-29 MED ORDER — SODIUM CHLORIDE 0.9% FLUSH: 3.0000 mL | INTRAVENOUS | Status: DC | PRN

## 2016-01-29 MED ORDER — PRENATAL MULTIVITAMIN CH
1.0000 | ORAL_TABLET | Freq: Every day | ORAL | Status: DC
  Administered 2016-01-30 – 2016-02-01 (×3): 1 via ORAL
  Filled 2016-01-29 (×3): qty 1

## 2016-01-29 MED ORDER — SODIUM BICARBONATE 8.4 % IV SOLN
INTRAVENOUS | Status: DC | PRN
  Administered 2016-01-29: 5 mL via EPIDURAL
  Administered 2016-01-29: 10 mL via EPIDURAL

## 2016-01-29 MED ORDER — METHYLERGONOVINE MALEATE 0.2 MG PO TABS: 0.2000 mg | ORAL_TABLET | ORAL | Status: DC | PRN

## 2016-01-29 MED ORDER — DIPHENHYDRAMINE HCL 25 MG PO CAPS
25.0000 mg | ORAL_CAPSULE | ORAL | Status: DC | PRN
  Filled 2016-01-29: qty 1

## 2016-01-29 MED ORDER — LACTATED RINGERS IV SOLN
INTRAVENOUS | Status: DC
  Administered 2016-01-29: 03:00:00 via INTRAUTERINE

## 2016-01-29 MED ORDER — OXYCODONE-ACETAMINOPHEN 5-325 MG PO TABS
2.0000 | ORAL_TABLET | ORAL | Status: DC | PRN
  Administered 2016-01-30 – 2016-02-01 (×9): 2 via ORAL
  Filled 2016-01-29 (×8): qty 2

## 2016-01-29 MED ORDER — ALBUTEROL SULFATE (2.5 MG/3ML) 0.083% IN NEBU: 3.0000 mL | INHALATION_SOLUTION | RESPIRATORY_TRACT | Status: DC | PRN

## 2016-01-29 MED ORDER — ONDANSETRON HCL 4 MG/2ML IJ SOLN: 4.0000 mg | Freq: Three times a day (TID) | INTRAMUSCULAR | Status: DC | PRN

## 2016-01-29 MED ORDER — SIMETHICONE 80 MG PO CHEW: 80.0000 mg | CHEWABLE_TABLET | ORAL | Status: DC | PRN

## 2016-01-29 MED ORDER — LACTATED RINGERS IV SOLN
INTRAVENOUS | Status: DC
  Administered 2016-01-29 – 2016-01-30 (×2): via INTRAVENOUS

## 2016-01-29 MED ORDER — MEASLES, MUMPS & RUBELLA VAC ~~LOC~~ INJ: 0.5000 mL | INJECTION | Freq: Once | SUBCUTANEOUS | Status: DC

## 2016-01-29 MED ORDER — TETANUS-DIPHTH-ACELL PERTUSSIS 5-2.5-18.5 LF-MCG/0.5 IM SUSP: 0.5000 mL | Freq: Once | INTRAMUSCULAR | Status: DC

## 2016-01-29 MED ORDER — SIMETHICONE 80 MG PO CHEW
80.0000 mg | CHEWABLE_TABLET | ORAL | Status: DC
  Administered 2016-01-31: 80 mg via ORAL
  Filled 2016-01-29 (×3): qty 1

## 2016-01-29 MED ORDER — FERROUS SULFATE 325 (65 FE) MG PO TABS
325.0000 mg | ORAL_TABLET | Freq: Two times a day (BID) | ORAL | Status: DC
  Administered 2016-01-30 – 2016-02-01 (×5): 325 mg via ORAL
  Filled 2016-01-29 (×5): qty 1

## 2016-01-29 MED ORDER — BUPIVACAINE HCL (PF) 0.25 % IJ SOLN
INTRAMUSCULAR | Status: DC | PRN
  Administered 2016-01-29: 8 mL via EPIDURAL

## 2016-01-29 MED ORDER — ONDANSETRON HCL 4 MG/2ML IJ SOLN
INTRAMUSCULAR | Status: AC
  Filled 2016-01-29: qty 2

## 2016-01-29 MED ORDER — METOCLOPRAMIDE HCL 5 MG/ML IJ SOLN: 10.0000 mg | Freq: Once | INTRAMUSCULAR | Status: DC | PRN

## 2016-01-29 MED ORDER — CHLOROPROCAINE HCL (PF) 3 % IJ SOLN
INTRAMUSCULAR | Status: AC
  Filled 2016-01-29: qty 20

## 2016-01-29 MED ORDER — IBUPROFEN 600 MG PO TABS
600.0000 mg | ORAL_TABLET | Freq: Four times a day (QID) | ORAL | Status: DC
Start: 2016-01-29 — End: 2016-02-01
  Administered 2016-01-30 – 2016-02-01 (×11): 600 mg via ORAL
  Filled 2016-01-29 (×11): qty 1

## 2016-01-29 MED ORDER — DEXAMETHASONE SODIUM PHOSPHATE 4 MG/ML IJ SOLN
INTRAMUSCULAR | Status: AC
  Filled 2016-01-29: qty 1

## 2016-01-29 MED ORDER — SENNOSIDES-DOCUSATE SODIUM 8.6-50 MG PO TABS
2.0000 | ORAL_TABLET | ORAL | Status: DC
  Administered 2016-01-30 – 2016-01-31 (×2): 2 via ORAL
  Filled 2016-01-29 (×3): qty 2

## 2016-01-29 MED ORDER — OXYTOCIN 10 UNIT/ML IJ SOLN
INTRAMUSCULAR | Status: AC
  Filled 2016-01-29: qty 4

## 2016-01-29 MED ORDER — DIPHENHYDRAMINE HCL 50 MG/ML IJ SOLN: 12.5000 mg | INTRAMUSCULAR | Status: DC | PRN

## 2016-01-29 MED ORDER — WITCH HAZEL-GLYCERIN EX PADS: 1.0000 "application " | MEDICATED_PAD | CUTANEOUS | Status: DC | PRN

## 2016-01-29 SURGICAL SUPPLY — 37 items
BARRIER ADHS 3X4 INTERCEED (GAUZE/BANDAGES/DRESSINGS) ×5 IMPLANT
BRR ADH 4X3 ABS CNTRL BYND (GAUZE/BANDAGES/DRESSINGS) ×2
CHLORAPREP W/TINT 26ML (MISCELLANEOUS) ×3 IMPLANT
CLAMP CORD UMBIL (MISCELLANEOUS) IMPLANT
CLOSURE WOUND 1/2 X4 (GAUZE/BANDAGES/DRESSINGS) ×1
CLOTH BEACON ORANGE TIMEOUT ST (SAFETY) ×3 IMPLANT
DRSG OPSITE POSTOP 4X10 (GAUZE/BANDAGES/DRESSINGS) ×5 IMPLANT
ELECT REM PT RETURN 9FT ADLT (ELECTROSURGICAL) ×3
ELECTRODE REM PT RTRN 9FT ADLT (ELECTROSURGICAL) ×1 IMPLANT
EXTRACTOR VACUUM BELL STYLE (SUCTIONS) IMPLANT
GLOVE BIO SURGEON STRL SZ7 (GLOVE) ×3 IMPLANT
GLOVE BIOGEL PI IND STRL 7.0 (GLOVE) ×3 IMPLANT
GLOVE BIOGEL PI INDICATOR 7.0 (GLOVE) ×6
GOWN STRL REUS W/TWL LRG LVL3 (GOWN DISPOSABLE) ×9 IMPLANT
KIT ABG SYR 3ML LUER SLIP (SYRINGE) IMPLANT
LIQUID BAND (GAUZE/BANDAGES/DRESSINGS) ×3 IMPLANT
NDL HYPO 25X5/8 SAFETYGLIDE (NEEDLE) IMPLANT
NEEDLE HYPO 25X5/8 SAFETYGLIDE (NEEDLE) IMPLANT
NS IRRIG 1000ML POUR BTL (IV SOLUTION) ×3 IMPLANT
PACK C SECTION WH (CUSTOM PROCEDURE TRAY) ×3 IMPLANT
PAD ABD 7.5X8 STRL (GAUZE/BANDAGES/DRESSINGS) ×6 IMPLANT
PAD OB MATERNITY 4.3X12.25 (PERSONAL CARE ITEMS) ×3 IMPLANT
PENCIL SMOKE EVAC W/HOLSTER (ELECTROSURGICAL) ×3 IMPLANT
RTRCTR C-SECT PINK 25CM LRG (MISCELLANEOUS) ×3 IMPLANT
SPONGE GAUZE 4X4 12PLY STER LF (GAUZE/BANDAGES/DRESSINGS) ×3 IMPLANT
STRIP CLOSURE SKIN 1/2X4 (GAUZE/BANDAGES/DRESSINGS) ×1 IMPLANT
SUT CHROMIC 0 CTX 36 (SUTURE) IMPLANT
SUT MON AB 4-0 PS1 27 (SUTURE) ×3 IMPLANT
SUT PLAIN 0 NONE (SUTURE) IMPLANT
SUT PLAIN 2 0 XLH (SUTURE) ×3 IMPLANT
SUT VIC AB 0 CTX 36 (SUTURE) ×15
SUT VIC AB 0 CTX36XBRD ANBCTRL (SUTURE) ×5 IMPLANT
SUT VIC AB 2-0 CT1 27 (SUTURE) ×3
SUT VIC AB 2-0 CT1 TAPERPNT 27 (SUTURE) ×1 IMPLANT
TAPE STRIPS DRAPE STRL (GAUZE/BANDAGES/DRESSINGS) ×3 IMPLANT
TOWEL OR 17X24 6PK STRL BLUE (TOWEL DISPOSABLE) ×3 IMPLANT
TRAY FOLEY CATH SILVER 14FR (SET/KITS/TRAYS/PACK) IMPLANT

## 2016-01-29 NOTE — Transfer of Care (Signed)
Immediate Anesthesia Transfer of Care Note  Patient: Debra SchlatterBriana A Compton  Procedure(s) Performed: Procedure(s): CESAREAN SECTION (N/A)  Patient Location: PACU  Anesthesia Type:Epidural  Level of Consciousness: awake, alert  and oriented  Airway & Oxygen Therapy: Patient Spontanous Breathing  Post-op Assessment: Report given to RN and Post -op Vital signs reviewed and stable  Post vital signs: Reviewed and stable  Last Vitals:  Filed Vitals:   01/29/16 1201 01/29/16 1216  BP: 131/89 132/84  Pulse: 94 106  Temp:    Resp:      Complications: No apparent anesthesia complications

## 2016-01-29 NOTE — Brief Op Note (Signed)
01/29/2016  1:55 PM  PATIENT:  [name]  25 y.o. female  PRE-OPERATIVE DIAGNOSIS:  IUP @ 39 6/7 weeks, Fetal Intolerance of labor, Failure to progress, Suspected Macrosomia, GBS Positive  POST-OPERATIVE DIAGNOSIS:  Same, CPD  PROCEDURE:  Procedure(s): CESAREAN SECTION (N/A), Primary LTCS  SURGEON:  Surgeon(s) and Role:    * Geryl RankinsEvelyn Tatum Corl, MD - Primary  PHYSICIAN ASSISTANT:   ASSISTANTS: Alphonzo Severanceachel Stall, CNM   ANESTHESIA:   local and epidural  EBL:  Total I/O In: 2000 [I.V.:2000] Out: 1950 [Urine:1050; Blood:900]  BLOOD ADMINISTERED:none  DRAINS: Urinary Catheter (Foley)   LOCAL MEDICATIONS USED:  OTHER Nesicaine  SPECIMEN:  Source of Specimen:  Placenta  DISPOSITION OF SPECIMEN:  PATHOLOGY  COUNTS:  YES  TOURNIQUET:  * No tourniquets in log *  DICTATION: .Other Dictation: Dictation Number [id number]  PLAN OF CARE: Transfer to postpartum after PACU  PATIENT DISPOSITION:  PACU - hemodynamically stable.   Delay start of Pharmacological VTE agent (>24hrs) due to surgical blood loss or risk of bleeding: yes

## 2016-01-29 NOTE — Consult Note (Signed)
Neonatology Note:   Attendance at C-section:    I was asked by Dr. Roberts to attend this C/S at term for failure to progress. The mother is a G1, GBS + with good prenatal care and aIAP. Max maternal temp 37.2 with ROM 29 hours before delivery, fluid clear. Infant vigorous with good spontaneous cry and tone. Needed only minimal bulb suctioning. Ap 8/9. Lungs clear to ausc in DR. [facility] EOS risk score 0.16 at birth. To CN to care of Pediatrician.  Shayle Donahoo C. Dejour Vos, MD  

## 2016-01-29 NOTE — Lactation Note (Signed)
This note was copied from a baby's chart. Lactation Consultation Note  Patient Name: [name]: 01/29/2016 Reason for consult: Initial assessment  Baby 9 hours old. Mom holding baby after bath STS. Assisted mom with attempting to latch baby in football position. Baby sleepy at breast. Discussed normal newborn behavior. Mom able to hand express colostrum and place on baby's lips, but baby still too sleepy to latch. Enc mom to keep giving STS and attempting to latch with cues. [facility], aware of OP/BFSG and LC phone line assistance after D/C.  Maternal Data Has patient been taught Hand Expression?: Yes Does the patient have breastfeeding experience prior to this delivery?: No  Feeding Feeding Type: Breast Fed Length of feed: 0 min  LATCH Score/Interventions Latch: Too sleepy or reluctant, no latch achieved, no sucking elicited. Intervention(s): Adjust position;Assist with latch;Breast compression  Audible Swallowing: None Intervention(s): Skin to skin;Hand expression  Type of Nipple: Everted at rest and after stimulation  Comfort (Breast/Nipple): Soft / non-tender     Hold (Positioning): Assistance needed to correctly position infant at breast and maintain latch. Intervention(s): Breastfeeding basics reviewed;Support Pillows;Position options;Skin to skin  LATCH Score: 5  Lactation Tools Discussed/Used     Consult Status Consult Status: Follow-up Compton: 01/30/16 Follow-up type: In-patient    [name], [name] 01/29/2016, 10:42 PM

## 2016-01-29 NOTE — Progress Notes (Signed)
Subjective: Resting, feeling more pressure and pain.  Family at bedside for support  Objective: BP 120/63 mmHg  Pulse 90  Temp(Src) 98.3 F (36.8 C) (Oral)  Resp 18  Ht 5\' 1"  (1.549 m)  Wt 92.987 kg (205 lb)  BMI 38.75 kg/m2  SpO2 97%  LMP 04/27/2015 (Approximate) I/O last 3 completed shifts: In: 156.3 [I.V.:156.3] Out: 1700 [Urine:1700]    FHT: Category 2, 130 bpm, moderate variability, accels, variable decelerations UC:   regular, every 2 minutes SVE:   Dilation: 7 Effacement (%): 90 Station: 0, +1 Exam by:: Fleet Contrasachel, CNM Membranes:  SROM at  0740 on 01/28/16, Forebag AROM at 0305  Internal: Fse IUPC , 200 mvus  GBS prophylaxis:  x2 doses vancomycin  Pain management:  epidural  Assessment:  IUP at 39.6  Active labor Adequate contractions SROM x 25.5 hrs, No S&S infection Cat 2 FT GBS positive  Plan: Continue intrauterine resuscitation, PRN Continue other management as ordered   Alphonzo Severanceachel Alondra Sahni CNM, [place] 01/29/2016, 8:17 AM

## 2016-01-29 NOTE — Progress Notes (Addendum)
Called by RN @ [phone] to review tracing.  Late decel at 0146 to nadir 120 bpm x 20 secs -- BL 140 Late decel at 0155 to nadir 140 bpm x 30 secs - BL 152 Late decel at 0159 to nadir 110 bpm x 40 secs - BL 150s Late decel at 0204 to nadir 120 bpm x 20 secs - BL 150s Late decel at 0241 to nadir 130 bpm x 10 secs - BL 150s Late decel at 0252, 0254, 0256 and 0258 to nadir 150 bpm x 15 secs - BL 160  Non repetitive variables noted. Earlys present.  Intrauterine resuscitative measures in place. Pitocin stopped.  Exam revealed BBOW - AROM'd at 0305, copious amount of clear fluid noted. Cvx unchanged from previous exam. FSE and IUPC placed. Amnioinfusion started at 0314. Will restart Pitocin when safe to do so.  Will consult Dr. Normand Sloopillard as needed.    Sherre ScarletKimberly Jermarcus Mcfadyen, CNM 01/29/16, 03:17 AM

## 2016-01-29 NOTE — Progress Notes (Signed)
In with CNM to introduce myself and discuss recommendations for cesarean section. Pt reporting pressure.  Cervix unchanged by RN and CNM x 4 hours. Leopolds 10 lb fetus Station 1+with caput Tracing currently Cat 1 but repetitive late decelerations noted. Contractions q2-3, adequate MVU.  A/P IUP 39 6/7 weeks Fetal Intolerance to labor. Failure to progress. Suspected Macrosomia. GBS positive on Vancomycin.  Reviewed risks of surgery and rationale for surgery.  Questions answered. Proceed with LTCS. Gentamycin and Clindamycin for prophylaxis.

## 2016-01-29 NOTE — Anesthesia Postprocedure Evaluation (Signed)
Anesthesia Post Note  Patient: [name]  Procedure(s) Performed: Procedure(s) (LRB): CESAREAN SECTION (N/A)  Patient location during evaluation: PACU Anesthesia Type: Spinal Level of consciousness: oriented and awake and alert Pain management: pain level controlled Vital Signs Assessment: post-procedure vital signs reviewed and stable Respiratory status: spontaneous breathing, respiratory function stable and patient connected to nasal cannula oxygen Cardiovascular status: blood pressure returned to baseline and stable Postop Assessment: no headache and no backache Anesthetic complications: no    Last Vitals:  Filed Vitals:   01/29/16 1533 01/29/16 1632  BP: 117/69 102/67  Pulse: 67 63  Temp: 36.7 C 36.7 C  Resp: 17 18    Last Pain:  Filed Vitals:   01/29/16 1637  PainSc: 0-No pain                 Phillips Groutarignan, Jaymien Landin

## 2016-01-29 NOTE — Progress Notes (Signed)
Dr. Normand Sloopillard reviewed tracing per my request. [name] noted. BL 130 w/ minimal variability - will place 02, continue close monitoring and other intrauterine resuscitative measures as needed. Pitocin restarted at 0455. Cvx 5/80/-2, -3 at 0452; + scalp stim. Inadequate MVUs at present; less than 100 - ctxs q 1-4 min. Will continue to titrate Pitocin to adequate MVUs.    [name], CNM 01/29/16, 5:34 AM

## 2016-01-29 NOTE — Progress Notes (Signed)
  Subjective: Denies fever or chills.  Objective: BP 102/49 mmHg  Pulse 90  Temp(Src) 98.5 F (36.9 C) (Oral)  Resp 18  Ht 5\' 1"  (1.549 m)  Wt 92.987 kg (205 lb)  BMI 38.75 kg/m2  SpO2 97%  LMP 04/27/2015 (Approximate) I/O last 3 completed shifts: In: 156.3 [I.V.:156.3] Out: -  Today's Vitals   01/29/16 0001 01/29/16 0031 01/29/16 0101 01/29/16 0131  BP: 94/53 102/61 125/69 102/49  Pulse: 87 92 107 90  Temp:   98.5 F (36.9 C)   TempSrc:   Oral   Resp: 18 18 18    Height:      Weight:      SpO2:      PainSc: 0-No pain  0-No pain     FHT: BL 140s w/ moderate variability, +accels, earlys, variable at 2332 x 30 secs to nadir 92 bpm, slow return to baseline of 130. Late at 2345 - 2346, BL 130 Late at 0113 to nadir 120 bpm x 30 secs - BL 140 Late at 0145 to nadir 120 bpm x 30 secs - BL 130  UC:   irregular, every 2-4 minutes SVE:   Dilation: 4 Effacement (%): 80 Station: -3, -2 Exam by:: B Boyer RN @ 0101 Pitocin at 6 mU/min  Assessment:  SROM x 18+ hrs; no concerns for infection Cat 2 tracing resolved w/ intrauterine resuscitative measures Latent labor  Plan: Monitor closely Continue intrauterine resuscitative measures prn Continue Pitocin augmentation  Sherre ScarletWILLIAMS, Xavyer Steenson CNM 01/29/2016, 1:50 AM

## 2016-01-29 NOTE — Progress Notes (Addendum)
Subjective: Called to rm for repetitive late decelerations. Pt resting, feels increased pressure and occasional pain.   Objective: BP 118/65 mmHg  Pulse 86  Temp(Src) 98.3 F (36.8 C) (Oral)  Resp 18  Ht 5\' 1"  (1.549 m)  Wt 92.987 kg (205 lb)  BMI 38.75 kg/m2  SpO2 95%  LMP 04/27/2015 (Approximate) I/O last 3 completed shifts: In: 156.3 [I.V.:156.3] Out: 1700 [Urine:1700]    FHT: Category 2, 130 bpm, moderate variability,occasional accels, late decelerations, repretitive UC:   regular, every 2-3 minutes SVE:   Dilation: 7 Effacement (%): 90 Station: 0, +1 Exam by:: Fleet Contrasachel, CNM Membranes: SROM at 0740 on 01/28/16, [place]bag AROM at 0305  Internal: Fse IUPC, 200 mvus  GBS prophylaxis: x2 doses vancomycin  Pain management: epidural  Assessment:  IUP at 39.6  Active labor Adequate contractions SROM x 26 hrs, No S&S infection Cat 2 FT GBS positive   Plan: Continue amnioinfusion Continue intrauterine resuscitation ( maternal IV bolus, maternal position change), PRN   Consider stopping pitocin and applying ozygen Continue other management as ordered  Alphonzo Severanceachel Temisha Murley CNM, [place] 01/29/2016, 10:08 AM

## 2016-01-30 LAB — CBC
HEMATOCRIT: 27.7 % — AB (ref 36.0–46.0)
HEMOGLOBIN: 9.5 g/dL — AB (ref 12.0–15.0)
MCH: 29.7 pg (ref 26.0–34.0)
MCHC: 34.3 g/dL (ref 30.0–36.0)
MCV: 86.6 fL (ref 78.0–100.0)
Platelets: 269 10*3/uL (ref 150–400)
RBC: 3.2 MIL/uL — ABNORMAL LOW (ref 3.87–5.11)
RDW: 15.8 % — ABNORMAL HIGH (ref 11.5–15.5)
WBC: 19.1 10*3/uL — AB (ref 4.0–10.5)

## 2016-01-30 NOTE — Progress Notes (Signed)
Vonzell SchlatterBriana A Birdsell [id number]  Subjective: Postpartum Day 1: Primary LTC/S due to fetal intolerance to labor, failure to progress, macrosomia and GBS positive Patient up ad lib, reports no syncope or dizziness. Feeding:  breast Contraceptive plan:  undecided  Objective: Temp:  [98 F (36.7 C)-98.9 F (37.2 C)] 98 F (36.7 C) (04/15 0830) Pulse Rate:  [60-68] 63 (04/15 0830) Resp:  [16-18] 17 (04/15 0830) BP: (102-127)/(54-70) 111/70 mmHg (04/15 0830) SpO2:  [95 %-97 %] 96 % (04/15 0830)  CBC Latest Ref Rng 01/30/2016 01/28/2016 08/19/2015  WBC 4.0 - 10.5 K/uL 19.1(H) 14.8(H) 11.5(H)  Hemoglobin 12.0 - 15.0 g/dL 1.1(B9.5(L) 11.9(L) 12.9  Hematocrit 36.0 - 46.0 % 27.7(L) 34.3(L) 36.4  Platelets 150 - 400 K/uL 269 278 210     Physical Exam:  General: alert and cooperative Lochia: appropriate Uterine Fundus: firm Abdomen:  + bowel sounds, NT Incision: Pressure dressing CDI DVT Evaluation: No evidence of DVT seen on physical exam. JP drain:   none  Assessment/Plan:  Status post cesarean delivery, day 1. Stable Continue current care. Breastfeeding   Alphonzo SeveranceRachel Jennavecia Schwier MSN, CNM 01/30/2016, 2:42 PM

## 2016-01-30 NOTE — Anesthesia Postprocedure Evaluation (Signed)
Anesthesia Post Note  Patient: [name]  Procedure(s) Performed: Procedure(s) (LRB): CESAREAN SECTION (N/A)  Patient location during evaluation: Mother Baby Anesthesia Type: Epidural Level of consciousness: awake Pain management: pain level controlled Vital Signs Assessment: post-procedure vital signs reviewed and stable Respiratory status: spontaneous breathing Cardiovascular status: stable Postop Assessment: patient able to bend at knees, no signs of nausea or vomiting and adequate PO intake Anesthetic complications: no    Last Vitals:  Filed Vitals:   01/29/16 2130 01/30/16 0300  BP:  108/59  Pulse:  64  Temp: 37.2 C 36.8 C  Resp:  18    Last Pain:  Filed Vitals:   01/30/16 0700  PainSc: 4                  Abigale Dorow Hristova

## 2016-01-30 NOTE — Op Note (Signed)
NAMMadaline Compton:  Compton, [name]                ACCOUNT NO.:  [id number]  MEDICAL RECORD NO.:  [id number]  LOCATION:  9126                          FACILITY:  [facility]  PHYSICIAN:  Debra PartridgeEvelyn B Jasey Cortez, MD   DATE OF BIRTH:  [birth year]  DATE OF PROCEDURE:  01/29/2016 DATE OF DISCHARGE:                              OPERATIVE REPORT   PREOPERATIVE DIAGNOSES:  Intrauterine pregnancy at 39-6/7th weeks, fetal intolerance to labor, failure to progress, suspected macrosomia and GBS positive.  POSTOPERATIVE DIAGNOSES:  Intrauterine pregnancy at 39-6/7th weeks, fetal intolerance to labor, failure to progress, suspected macrosomia and GBS positive, CPD.  PROCEDURE:  Primary low-transverse cesarean section with 2-layer closure.  SURGEON:  Debra PartridgeEvelyn B Wilho Sharpley, MD  ASSISTANTS:  [name], certified nurse midwife.  ANESTHESIA:  Local and epidural.  EBL:  900.  BLOOD ADMINISTERED:  None.  DRAINS:  Foley catheter.  SPECIMEN:  Placenta to Pathology.  COMPLICATIONS:  None.  DISPOSITION:  To PACU, hemodynamically stable.  FINDINGS:  Viable female infant in the vertex position with compound presentation of the left hand, CPD suspected or head was wedged into the pelvis.  Copious amounts of amniotic fluid and fluid from the amnion assured normal uterus, fallopian tubes, and ovaries.  PROCEDURE IN DETAIL:  Ms. Debra ReachBooth was taken from room #173 birthing suite to the C-section suite.  Epidural anesthesia was optimized.  She was prepped and draped in a normal sterile fashion.  Time-out was taken. Gentamicin and clindamycin were hung.  SCDs were on and operating.  After adequate anesthesia was confirmed, then abdomen was marked.  A Pfannenstiel skin incision was made with the scalpel and carried down to the underlying layer of the fascia with the Bovie.  There were two large vessels laterally in the subcutaneous space that were cauterized with hemostats.  Fascia was incised at the midline with the Bovie  and extended laterally with the curved Mayo scissors.  Rectus muscles were dissected sharply off the fascia.  Rectus muscles were easily separated at the midline.  Peritoneum identified, grasped with a hemostat x2 and entered sharply with the Metzenbaum scissors.  Intraabdominal access was confirmed, then, peritoneum was stretched.  Abdomen was palpated. Alexis retractor was then placed.  Bladder flap was developed grasping the serosa and helping the serosa with the Metzenbaum scissors and extended laterally on each side.  Then, a transverse incision was made on the lower uterine segment and extended laterally with the bandage scissors.  There was a copious amount of fluid that just kind of funneled, so that could not even see the baby. I was able to Compton down and get my hand in, but it was very very tight. I was able to bring the head to the incision and with fundal pressure, he was delivered easily, no nuchal cord noted.  Mouth was suctioned and shoulders were delivered.  Delayed cord clamping was performed.  For 1 minute, the angles of the hysterotomy incision were grasped with the ring forceps while we were waiting.  The placenta was then removed and all trailing membranes were removed with the ring forceps.  A moist laparotomy sponge x2 was used to clear out the uterus of all clots  and debris.  There was herniation of the small bowel.  Moisten laparotomy sponge with a tag was placed in the abdomen, which was later removed.  The hysterotomy incision was closed with 1-0 Vicryl in a running locked fashion.  Second layer of the same suture was used for imbrication. There was a small hemostat.  There was a small area where I performed a U stitch for hemostasis.  Copious irrigation was performed of the abdomen.  The fallopian tubes and ovaries were inspected.  Interceed was then applied at the hysterotomy incision.  The Alexis retractor was then removed.  The peritoneum was then  reapproximated with 2-0 Vicryl.  At this point, the patient did have some discomfort.  She was complaining of a burning pain.  The bowels were herniating, so we did pause for a moment. Nesacaine was poured and we waited little over 2 minutes to allow that to absorb.  The patient was more comfortable.  The remainder of the peritoneum was closed after inspection of the fascia and rectus muscle. Fascia was then reapproximated with 0 Vicryl in a continuous running fashion with the Kocher at both ends.  There was a malleable that was placed to retract the bowel when I was closing the peritoneum.  Subcutaneous space was then closed with 2-0 plain gut in a continuous running fashion.  Skin was approximated with 4-0 Monocryl and Dermabond was to be applied with a pressure dressing.     Debra Partridge, MD     EBV/MEDQ  D:  01/29/2016  T:  01/30/2016  Job:  [id number]

## 2016-01-30 NOTE — Lactation Note (Signed)
This note was copied from a baby's chart. Lactation Consultation Note  Patient Name: [name] Reason for consult: Follow-up assessment  Baby 29 hours old. Assisted mom with positioning and latching baby to right breast in football position. Mom able to hand express with colostrum present. Enc mom to latch without assistance, but mom kept putting her fingers between baby's mouth and nipple. Enc mom to support breast from the side and then continue to support the breast and nipple while baby begins suckling so that baby can achieve and then maintain a deep latch. With assistance, baby did latch with lips flanged and intermittent swallows noted. Enc mom to keep nursing with cues and call for assistance with latching as needed.  Maternal Data    Feeding Feeding Type: Breast Fed Length of feed:  (LC assessed first 10 minutes of BF. )  LATCH Score/Interventions Latch: Grasps breast easily, tongue down, lips flanged, rhythmical sucking. Intervention(s): Assist with latch;Adjust position;Breast compression  Audible Swallowing: Spontaneous and intermittent  Type of Nipple: Everted at rest and after stimulation  Comfort (Breast/Nipple): Soft / non-tender     Hold (Positioning): Assistance needed to correctly position infant at breast and maintain latch. Intervention(s): Support Pillows  LATCH Score: 9  Lactation Tools Discussed/Used     Consult Status Consult Status: Follow-up Date: 01/31/16 Follow-up type: In-patient    [name], [name] Compton, 6:26 PM

## 2016-01-30 NOTE — Addendum Note (Signed)
Addendum  created 01/30/16 0820 by Elgie CongoNataliya H Kristopher Attwood, CRNA   Modules edited: Clinical Notes   Clinical Notes:  File: [id number]

## 2016-01-31 DIAGNOSIS — Z98891 History of uterine scar from previous surgery: Secondary | ICD-10-CM

## 2016-01-31 NOTE — Lactation Note (Signed)
This note was copied from a baby's chart. Lactation Consultation Note  Patient Name: [name] JXBJY'NToday's Date: 01/31/2016 Reason for consult: Follow-up assessment;Infant weight loss   Follow up with first time mom of 4252 hour old infant. Infant with 12 BF for 10-25 minutes, 0 voids and 3 stools in last 24 hours. LATCH Scores 8-9 by bedside RN's. Infant weight 6 lb 9.6 oz with 7% weight loss since birth.   Infant was asleep when I went into room, it was reported by family he just finished BF. Mom reports her breasts do not feel any fuller today. Mom with Hx hyper thyroidism. Mom breasts are very soft and was able to hand express a few gtts from each side.   Awakened infant and had him suckle on my gloved finger, he keeps tongue pulled back in mouth and did not form a good seal on finger. Mom reports there recent feedings have been more painful with pinching noted throughout feeding. We then placed him STS to right breast in football hold. He latched easily with rhythmic suckling initially and very few swallows noted. Infant then fell asleep within 2-3 minutes of feeding requiring awakening techniques to maintain suckling he then was not awakening to continue feeding after 10 minutes. Then had mom pump for 15 minutes on Initiate setting and she received about 1 cc from left breast, none from right. Was able to hand express another 1 cc.   Mom with soft compressible breasts and short shafted small nipples.   His mucous membranes were noted to be dry and based on feeding behavior at breast and amount milk that was obtained, and decreased output, the decision was made to start supplementation on infant every 3 hours after BF. Mom agreeable to supplementation since felt to be in best interest of infant. Pumping, cleaning of pump and BM storage reviewed.   Infant was fed 2 cc EBM/15 cc Alimentum via paced bottle feeding by M. Stefano GaulStringer, Charity fundraiserN. He tolerated feeding well.   Plan BF infant 8-12 x in 24  hours at first feeding cues, awaken at 3 hours if infant not cueing to feed Supplement infant with 15 cc EBM/ Alimentum via bottle every 3 hours after BF Pump both breast post BF for 15 minutes on Initiate setting Hand express post pumping    Maternal Data Formula Feeding for Exclusion: No Has patient been taught Hand Expression?: Yes Does the patient have breastfeeding experience prior to this delivery?: No  Feeding Feeding Type: Breast Fed Length of feed: 10 min  LATCH Score/Interventions Latch: Repeated attempts needed to sustain latch, nipple held in mouth throughout feeding, stimulation needed to elicit sucking reflex. Intervention(s): Skin to skin;Teach feeding cues;Waking techniques Intervention(s): Breast massage;Assist with latch;Breast compression  Audible Swallowing: A few with stimulation Intervention(s): Skin to skin;Hand expression Intervention(s): Skin to skin;Hand expression  Type of Nipple: Everted at rest and after stimulation  Comfort (Breast/Nipple): Filling, red/small blisters or bruises, mild/mod discomfort  Problem noted: Mild/Moderate discomfort  Hold (Positioning): Assistance needed to correctly position infant at breast and maintain latch. Intervention(s): Breastfeeding basics reviewed;Support Pillows;Position options;Skin to skin  LATCH Score: 6  Lactation Tools Discussed/Used Tools: Pump Breast pump type: Double-Electric Breast Pump Pump Review: Setup, frequency, and cleaning;Milk Storage   Consult Status Consult Status: Follow-up Date: 02/01/16 Follow-up type: In-patient    [name] 01/31/2016, 5:26 PM

## 2016-01-31 NOTE — Progress Notes (Signed)
Debra SchlatterBriana A Compton [id number]  Subjective: Postpartum Day 2: Primary LTC/S due to fetal intolerance of labor,failure to progress, macrosomia and GBS positive Patient up ad lib, reports no syncope or dizziness. Feeding:  Breast  Contraceptive plan:  Considering Micronor  Pain well controlled with ibuprofen and percocet  Objective: Temp:  [97.8 F (36.6 C)] 97.8 F (36.6 C) (04/15 1805) Pulse Rate:  [61] 61 (04/15 1805) Resp:  [18] 18 (04/15 1805) BP: (126)/(70) 126/70 mmHg (04/15 1805) SpO2:  [99 %] 99 % (04/15 1805)  CBC Latest Ref Rng 01/30/2016 01/28/2016 08/19/2015  WBC 4.0 - 10.5 K/uL 19.1(H) 14.8(H) 11.5(H)  Hemoglobin 12.0 - 15.0 g/dL 9.6(E9.5(L) 11.9(L) 12.9  Hematocrit 36.0 - 46.0 % 27.7(L) 34.3(L) 36.4  Platelets 150 - 400 K/uL 269 278 210     Physical Exam:  General: alert and cooperative Lochia: appropriate Uterine Fundus: firm Abdomen:  + bowel sounds, NT Incision: Honeycomb dressing CDI DVT Evaluation: No evidence of DVT seen on physical exam. JP drain:   none  Assessment/Plan: Status post cesarean delivery, day 2. Stable Breast feeding Continue current care. Plan for discharge tomorrow    Alphonzo SeveranceRachel Hetty Linhart MSN, CNM 01/31/2016, 10:16 AM

## 2016-02-01 ENCOUNTER — Encounter (HOSPITAL_COMMUNITY): Payer: Self-pay | Admitting: Obstetrics and Gynecology

## 2016-02-01 MED ORDER — FERROUS SULFATE 325 (65 FE) MG PO TABS: 325.0000 mg | ORAL_TABLET | Freq: Two times a day (BID) | ORAL | Status: DC

## 2016-02-01 MED ORDER — OXYCODONE-ACETAMINOPHEN 5-325 MG PO TABS: 1.0000 | ORAL_TABLET | ORAL | Status: DC | PRN

## 2016-02-01 MED ORDER — IBUPROFEN 600 MG PO TABS: 600.0000 mg | ORAL_TABLET | Freq: Four times a day (QID) | ORAL | Status: DC

## 2016-02-01 MED ORDER — SENNOSIDES-DOCUSATE SODIUM 8.6-50 MG PO TABS: 2.0000 | ORAL_TABLET | ORAL | Status: DC

## 2016-02-01 NOTE — Lactation Note (Signed)
This note was copied from a baby's chart. Lactation Consultation Note  Mom has been supplementing with formula because of low output yesterday.  Breasts are becoming full this AM.  Observed mom independently latch baby to breast.  Latch deep and baby nursed actively with audible gulps.  Instructed mom to discontinue formula now that milk is in.  Reviewed basics and engorgement treatment.  Lactation outpatient services reviewed and encouraged.  Patient Name: [name] ZOXWR'UToday's Date: 02/01/2016 Reason for consult: Follow-up assessment   Maternal Data    Feeding Feeding Type: Breast Fed Length of feed: 5 min  LATCH Score/Interventions Latch: Grasps breast easily, tongue down, lips flanged, rhythmical sucking. Intervention(s): Skin to skin  Audible Swallowing: Spontaneous and intermittent  Type of Nipple: Everted at rest and after stimulation  Comfort (Breast/Nipple): Soft / non-tender     Hold (Positioning): No assistance needed to correctly position infant at breast.  LATCH Score: 10  Lactation Tools Discussed/Used     Consult Status Consult Status: Complete    Huston FoleyMOULDEN, Maryl Blalock S 02/01/2016, 9:03 AM

## 2016-02-01 NOTE — Discharge Summary (Signed)
[facility]   Patient Name:   Debra SchlatterBriana A Compton DOB:     April 10, 1991 MRN:     [id number]  Date of Admission:   01/28/2016 Date of Discharge:  02/01/2016  Admitting diagnosis:    39WKS CONTRACTIONS WATER BROKE Principal Problem:   Status post primary low transverse cesarean section Active Problems:   Delayed delivery after SROM (spontaneous rupture of membranes)  Term Pregnancy Delivered and Anemia    Discharge diagnosis:    39WKS CONTRACTIONS WATER BROKE Principal Problem:   Status post primary low transverse cesarean section Active Problems:   Delayed delivery after SROM (spontaneous rupture of membranes)  Term Pregnancy Delivered and Anemia                                                                     Post partum procedures: n/a  Type of Delivery:  CS  Delivering Provider: Geryl RankinsVARNADO, EVELYN   Date of Delivery:  01/29/16  Newborn Data:    Live born female  Birth Weight: 7 lb 1.2 oz (3209 g) APGAR: 8, 9  Baby's Name:  [name] Baby Feeding:   Breast Disposition:   home with mother  Complications:   None  Hospital course:      Onset of Labor With Unplanned C/S  25 y.o. yo G1P1001 at 651w6d was admitted in Latent Labor on 01/28/2016.  Membrane Rupture Time/Date: 7:40 AM ,01/28/2016   The patient went for cesarean section due to Macrosomia, Arrest of Dilation and Non-Reassuring FHR, and delivered a Viable infant,01/29/2016  Details of operation can be found in separate operative note. Patient had an uncomplicated postpartum course.  She is ambulating,tolerating a regular diet, passing flatus, and urinating well.  Patient is discharged home in stable condition 02/01/2016.  Physical Exam:   Filed Vitals:   01/30/16 0830 01/30/16 1805 01/31/16 1800 02/01/16 0520  BP: 111/70 126/70 119/72 131/75  Pulse: 63 61 65 67  Temp: 98 F (36.7 C) 97.8 F (36.6 C) 97.7 F (36.5 C) 98.6 F (37 C)  TempSrc: Oral Oral Oral Oral  Resp: 17 18 19 20    Height:      Weight:      SpO2: 96% 99% 100%    General: alert, cooperative and no distress Lochia: appropriate Uterine Fundus: firm Incision: Healing well with no significant drainage DVT Evaluation: No evidence of DVT seen on physical exam.  Labs: Lab Results  Component Value Date   WBC 19.1* 01/30/2016   HGB 9.5* 01/30/2016   HCT 27.7* 01/30/2016   MCV 86.6 01/30/2016   PLT 269 01/30/2016   CMP Latest Ref Rng 05/27/2013  Glucose 70 - 99 mg/dL 99  BUN 6 - 23 mg/dL 13  Creatinine 8.460.50 - 9.621.10 mg/dL 9.520.66  Sodium 841135 - 324145 mEq/L 141  Potassium 3.5 - 5.1 mEq/L 4.2  Chloride 96 - 112 mEq/L 104  CO2 19 - 32 mEq/L 25  Calcium 8.4 - 10.5 mg/dL 9.9  Total Protein 6.0 - 8.3 g/dL 7.9  Total Bilirubin 0.3 - 1.2 mg/dL 0.3  Alkaline Phos 39 - 117 U/L 100  AST 0 - 37 U/L 21  ALT 0 - 35 U/L 15    Discharge instruction: per After Visit Summary and "Baby and  Me Booklet".  After Visit Meds:    Medication List    STOP taking these medications        acetaminophen 500 MG tablet  Commonly known as:  TYLENOL     calcium carbonate 500 MG chewable tablet  Commonly known as:  TUMS - dosed in mg elemental calcium      TAKE these medications        albuterol 108 (90 Base) MCG/ACT inhaler  Commonly known as:  PROVENTIL HFA;VENTOLIN HFA  Inhale 2 puffs into the lungs every 4 (four) hours as needed for wheezing or shortness of breath.     [employer] PRENATAL GUMMY PO  Take 2 tablets by mouth daily.     ferrous sulfate 325 (65 FE) MG tablet  Take 1 tablet (325 mg total) by mouth 2 (two) times daily with a meal.     oxyCODONE-acetaminophen 5-325 MG tablet  Commonly known as:  PERCOCET/ROXICET  Take 1 tablet by mouth every 4 (four) hours as needed (pain scale 4-7).     senna-docusate 8.6-50 MG tablet  Commonly known as:  Senokot-S  Take 2 tablets by mouth daily.        Diet: routine diet  Activity: Advance as tolerated. Pelvic rest for 6 weeks.   Outpatient follow up:6  weeks Follow up Appt:No future appointments. Follow up visit: No Follow-up on file.  Postpartum contraception: Progesterone only pills  02/01/2016 Ann-Marie Kluge, CNM

## 2016-02-04 ENCOUNTER — Encounter (HOSPITAL_COMMUNITY): Payer: Self-pay | Admitting: Obstetrics and Gynecology

## 2016-02-04 NOTE — Addendum Note (Signed)
Addendum  created 02/04/16 1256 by Phillips GroutPeter Thoams Siefert, MD   Modules edited: Anesthesia Events, Narrator   Narrator:  Narrator: Event Log Edited

## 2016-06-07 ENCOUNTER — Encounter (HOSPITAL_COMMUNITY): Payer: Self-pay | Admitting: Emergency Medicine

## 2016-06-07 ENCOUNTER — Emergency Department (HOSPITAL_COMMUNITY)
Admission: EM | Admit: 2016-06-07 | Discharge: 2016-06-07 | Disposition: A | Discharge status: Home / Self Care | Payer: BLUE CROSS/BLUE SHIELD | Attending: Emergency Medicine | Admitting: Emergency Medicine

## 2016-06-07 DIAGNOSIS — J45909 Unspecified asthma, uncomplicated: Secondary | ICD-10-CM | POA: Diagnosis not present

## 2016-06-07 DIAGNOSIS — R51 Headache: Secondary | ICD-10-CM | POA: Diagnosis present

## 2016-06-07 DIAGNOSIS — E039 Hypothyroidism, unspecified: Secondary | ICD-10-CM | POA: Insufficient documentation

## 2016-06-07 DIAGNOSIS — R519 Headache, unspecified: Secondary | ICD-10-CM

## 2016-06-07 LAB — POC URINE PREG, ED: PREG TEST UR: NEGATIVE

## 2016-06-07 MED ORDER — DIPHENHYDRAMINE HCL 50 MG/ML IJ SOLN
25.0000 mg | Freq: Once | INTRAMUSCULAR | Status: AC
  Administered 2016-06-07: 25 mg via INTRAVENOUS
  Filled 2016-06-07: qty 1

## 2016-06-07 MED ORDER — PROCHLORPERAZINE EDISYLATE 5 MG/ML IJ SOLN
10.0000 mg | Freq: Once | INTRAMUSCULAR | Status: AC
  Administered 2016-06-07: 10 mg via INTRAVENOUS
  Filled 2016-06-07: qty 2

## 2016-06-07 MED ORDER — SODIUM CHLORIDE 0.9 % IV BOLUS (SEPSIS)
1000.0000 mL | Freq: Once | INTRAVENOUS | Status: AC
  Administered 2016-06-07: 1000 mL via INTRAVENOUS

## 2016-06-07 MED ORDER — KETOROLAC TROMETHAMINE 30 MG/ML IJ SOLN
30.0000 mg | Freq: Once | INTRAMUSCULAR | Status: AC
  Administered 2016-06-07: 30 mg via INTRAVENOUS
  Filled 2016-06-07: qty 1

## 2016-06-07 NOTE — ED Provider Notes (Signed)
[facility]-EMERGENCY DEPT Provider Note   CSN: [id number] Arrival date & time: 06/07/16  [id number]     History   Chief Complaint Chief Complaint  Patient presents with  . Migraine    HPI [name] is a 25 y.o. female.  The history is provided by the patient.  Migraine  This is a new problem. Episode onset: 3 days ago. The problem occurs constantly. The problem has been gradually worsening. Pertinent negatives include no chest pain, no abdominal pain and no shortness of breath. Exacerbated by: loud noise. Nothing relieves the symptoms. She has tried acetaminophen for the symptoms. The treatment provided no relief.   Endorses occasional blurry vision, lightheadedness and dizziness, generalized weakness. No extremity numbness or weakness. No recent head injury. No sudden onset of severe headache. No fever, rash, cough/cold symptom, tick bite, or recent illness.  Past Medical History:  Diagnosis Date  . Asthma   . Hyperthyroidism   . Scoliosis   . Scoliosis     Patient Active Problem List   Diagnosis Date Noted  . Status post primary low transverse cesarean section 01/31/2016  . Delayed delivery after SROM (spontaneous rupture of membranes) 01/28/2016  . 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 Allergy to amoxicillin 01/06/2016  . Scoliosis 07/16/2015  . Hyperthyroidism 07/16/2015  . Asthma 07/16/2015  . Sickle cell trait (HCC) 07/16/2015    Past Surgical History:  Procedure Laterality Date  . CESAREAN SECTION N/A 01/29/2016   Procedure: CESAREAN SECTION;  Surgeon: Geryl RankinsEvelyn Varnado, MD;  Location: [facility];  Service: Obstetrics;  Laterality: N/A;  . NO PAST SURGERIES      OB History    Gravida Para Term Preterm AB Living   1 1 1     1    SAB TAB Ectopic Multiple Live Births         0 1       Home Medications    Prior to Admission medications   Medication Sig Start Date End Date Taking? Authorizing Provider  albuterol (PROVENTIL  HFA;VENTOLIN HFA) 108 (90 BASE) MCG/ACT inhaler Inhale 2 puffs into the lungs every 4 (four) hours as needed for wheezing or shortness of breath. 09/11/13  Yes Kristen N Ward, DO  PRESCRIPTION MEDICATION Take 1 tablet by mouth daily. Name unknown   Yes Historical Provider, MD    Family History Family History  Problem Relation Age of Onset  . Diabetes Mother     type 2  . Hypothyroidism Mother   . Asthma Father   . Asthma Sister   . Hypothyroidism Sister     Social History Social History  Substance Use Topics  . Smoking status: Never Smoker  . Smokeless tobacco: Never Used  . Alcohol use No     Allergies   Amoxicillin   Review of Systems Review of Systems  Respiratory: Negative for shortness of breath.   Cardiovascular: Negative for chest pain.  Gastrointestinal: Negative for abdominal pain.   10 Systems reviewed and are negative for acute change except as noted in the HPI.   Physical Exam Updated Vital Signs BP 115/72   Pulse 71   Temp 98.1 F (36.7 C) (Oral)   Resp 12   Ht 5\' 1"  (1.549 m)   Wt 179 lb (81.2 kg)   SpO2 99%   BMI 33.82 kg/m   Physical Exam  Constitutional: She is oriented to person, place, and time. She appears well-developed and well-nourished. No distress.  Uncomfortable, Awake, alert  HENT:  Head: Normocephalic and atraumatic.  Eyes: Conjunctivae and EOM are normal. Pupils are equal, round, and reactive to light.  Neck: Neck supple.  Cardiovascular: Normal rate, regular rhythm and normal heart sounds.   No murmur heard. Pulmonary/Chest: Effort normal and breath sounds normal. No respiratory distress.  Abdominal: Soft. Bowel sounds are normal. She exhibits no distension.  Musculoskeletal: She exhibits no edema.  Neurological: She is alert and oriented to person, place, and time. She has normal reflexes. No cranial nerve deficit. She exhibits normal muscle tone.  Fluent speech, normal finger-to-nose testing, negative pronator drift, no  clonus 5/5 strength and normal sensation x all 4 extremities  Skin: Skin is warm and dry. No rash noted.  Psychiatric: She has a normal mood and affect. Judgment and thought content normal.  Nursing note and vitals reviewed.    ED Treatments / Results  Labs (all labs ordered are listed, but only abnormal results are displayed) Labs Reviewed  POC URINE PREG, ED    EKG  EKG Interpretation None       Radiology No results found.  Procedures Procedures (including critical care time)  Medications Ordered in ED Medications  sodium chloride 0.9 % bolus 1,000 mL (0 mLs Intravenous Stopped 06/07/16 1134)  diphenhydrAMINE (BENADRYL) injection 25 mg (25 mg Intravenous Given 06/07/16 1019)  prochlorperazine (COMPAZINE) injection 10 mg (10 mg Intravenous Given 06/07/16 1019)  ketorolac (TORADOL) 30 MG/ML injection 30 mg (30 mg Intravenous Given 06/07/16 1131)     Initial Impression / Assessment and Plan / ED Course  I have reviewed the triage vital signs and the nursing notes.  Pertinent labs that were available during my care of the patient were reviewed by me and considered in my medical decision making (see chart for details).  Clinical Course   Patient presents with gradual onset of headache 3 days ago that has persisted despite taking Tylenol. Sister has history of migraines. No infectious symptoms or recent head trauma. She was awake and alert, in no acute distress on exam. Normal vital signs. Normal neurologic exam. Placed IV and gave Benadryl, Compazine, and fluid bolus. UPT negative therefore gave Toradol.  On reexamination, the patient was well-appearing and stated that her headache was completely resolved. Her only symptom was sleepiness.  The patient denies any neurologic symptoms such as visual changes, focal numbness/weakness, balance problems, confusion, or speech difficulty to suggest a life-threatening intracranial process such as intracranial hemorrhage or mass. I feel  that the patient is safe for discharge home without any head imaging at this time. I have reviewed return precautions including development of neurologic symptoms, confusion, lethargy, or difficulty speaking and patient has voiced understanding. Patient discharged in satisfactory condition.   Final Clinical Impressions(s) / ED Diagnoses   Final diagnoses:  Acute nonintractable headache, unspecified headache type    New Prescriptions New Prescriptions   No medications on file     Laurence Spatesachel Morgan Jaryan Chicoine, MD 06/07/16 1349

## 2016-06-07 NOTE — ED Notes (Signed)
Pt escorted to restroom with this RN via wheelchair

## 2016-06-07 NOTE — ED Triage Notes (Signed)
Pt sts HA x 3 days without hx of same; pt sts feeling week this am and some dizziness

## 2016-06-07 NOTE — ED Notes (Signed)
Asking to see the doctor

## 2016-06-07 NOTE — ED Notes (Signed)
Pt. Denies any more questions after speaking with MD. Going home with mom

## 2017-01-20 ENCOUNTER — Inpatient Hospital Stay (HOSPITAL_COMMUNITY)
Admission: AD | Admit: 2017-01-20 | Discharge: 2017-01-20 | Disposition: A | Discharge status: Home / Self Care | Payer: BLUE CROSS/BLUE SHIELD | Source: Ambulatory Visit | Attending: Obstetrics & Gynecology | Admitting: Obstetrics & Gynecology

## 2017-01-20 ENCOUNTER — Encounter (HOSPITAL_COMMUNITY): Payer: Self-pay | Admitting: *Deleted

## 2017-01-20 DIAGNOSIS — N926 Irregular menstruation, unspecified: Secondary | ICD-10-CM | POA: Insufficient documentation

## 2017-01-20 DIAGNOSIS — J45909 Unspecified asthma, uncomplicated: Secondary | ICD-10-CM | POA: Insufficient documentation

## 2017-01-20 DIAGNOSIS — D573 Sickle-cell trait: Secondary | ICD-10-CM | POA: Insufficient documentation

## 2017-01-20 DIAGNOSIS — Z825 Family history of asthma and other chronic lower respiratory diseases: Secondary | ICD-10-CM | POA: Insufficient documentation

## 2017-01-20 DIAGNOSIS — N83209 Unspecified ovarian cyst, unspecified side: Secondary | ICD-10-CM | POA: Insufficient documentation

## 2017-01-20 DIAGNOSIS — E0590 Thyrotoxicosis, unspecified without thyrotoxic crisis or storm: Secondary | ICD-10-CM | POA: Insufficient documentation

## 2017-01-20 DIAGNOSIS — N939 Abnormal uterine and vaginal bleeding, unspecified: Secondary | ICD-10-CM

## 2017-01-20 DIAGNOSIS — Z880 Allergy status to penicillin: Secondary | ICD-10-CM | POA: Diagnosis not present

## 2017-01-20 DIAGNOSIS — R102 Pelvic and perineal pain: Secondary | ICD-10-CM | POA: Diagnosis present

## 2017-01-20 DIAGNOSIS — Z8349 Family history of other endocrine, nutritional and metabolic diseases: Secondary | ICD-10-CM | POA: Insufficient documentation

## 2017-01-20 DIAGNOSIS — Z3202 Encounter for pregnancy test, result negative: Secondary | ICD-10-CM | POA: Insufficient documentation

## 2017-01-20 DIAGNOSIS — M419 Scoliosis, unspecified: Secondary | ICD-10-CM | POA: Insufficient documentation

## 2017-01-20 LAB — URINALYSIS, ROUTINE W REFLEX MICROSCOPIC
BILIRUBIN URINE: NEGATIVE
Glucose, UA: NEGATIVE mg/dL
KETONES UR: NEGATIVE mg/dL
NITRITE: NEGATIVE
PH: 5 (ref 5.0–8.0)
PROTEIN: NEGATIVE mg/dL
Specific Gravity, Urine: 1.012 (ref 1.005–1.030)

## 2017-01-20 LAB — CBC
HCT: 37.9 % (ref 36.0–46.0)
Hemoglobin: 13.1 g/dL (ref 12.0–15.0)
MCH: 31 pg (ref 26.0–34.0)
MCHC: 34.6 g/dL (ref 30.0–36.0)
MCV: 89.8 fL (ref 78.0–100.0)
PLATELETS: 310 10*3/uL (ref 150–400)
RBC: 4.22 MIL/uL (ref 3.87–5.11)
RDW: 14.8 % (ref 11.5–15.5)
WBC: 10.1 10*3/uL (ref 4.0–10.5)

## 2017-01-20 LAB — HCG, QUANTITATIVE, PREGNANCY: hCG, Beta Chain, Quant, S: <1 m[IU]/mL (ref <5)

## 2017-01-20 LAB — POCT PREGNANCY, URINE: Preg Test, Ur: NEGATIVE

## 2017-01-20 MED ORDER — IBUPROFEN 600 MG PO TABS
600.0000 mg | ORAL_TABLET | ORAL | Status: AC
  Administered 2017-01-20: 600 mg via ORAL
  Filled 2017-01-20: qty 1

## 2017-01-20 NOTE — MAU Provider Note (Signed)
History   26 yo presented unannounced c/o positive home UPT yesterday ("faint"), and no cycle since 11/19/16.  Had onset of mild spotting and cramping this am, no heavy bleeding.  Has not taken anything for pain.  Was on OCPs, "ran out" around Christmas, didn't start new pack until mid-January.  Has not missed any additional pills since restarting.   Known A+ blood type  Patient Active Problem List   Diagnosis Date Noted  . Status post primary low transverse cesarean section 01/31/2016  . Delayed delivery after SROM (spontaneous rupture of membranes) 01/28/2016  . 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 Allergy to amoxicillin 01/06/2016  . Scoliosis 07/16/2015  . Hyperthyroidism 07/16/2015  . Asthma 07/16/2015  . Sickle cell trait (HCC) 07/16/2015     HPI:  As above  OB History    Gravida Para Term Preterm AB Living   SAB TAB Ectopic Multiple Live Births         0 1    01/2016--Primary LTCS at 61 6/7 weeks, due to [facility] and FTP, female, 7+1.    Past Medical History:  Diagnosis Date  . Asthma   . Hyperthyroidism   . Scoliosis   . Scoliosis     Past Surgical History:  Procedure Laterality Date  . CESAREAN SECTION N/A 01/29/2016   Procedure: CESAREAN SECTION;  Surgeon: Geryl Rankins, MD;  Location: [facility];  Service: Obstetrics;  Laterality: N/A;    Family History  Problem Relation Age of Onset  . Diabetes Mother     type 2  . Hypothyroidism Mother   . Asthma Father   . Asthma Sister   . Hypothyroidism Sister     Social History  Substance Use Topics  . Smoking status: Never Smoker  . Smokeless tobacco: Never Used  . Alcohol use No    Allergies:  Allergies  Allergen Reactions  . Amoxicillin Hives and Nausea And Vomiting    Has patient had a PCN reaction causing immediate rash, facial/tongue/throat swelling, SOB or lightheadedness with hypotension: Yes Has patient had a PCN reaction causing severe rash involving  mucus membranes or skin necrosis: No Has patient had a PCN reaction that required hospitalization No Has patient had a PCN reaction occurring within the last 10 years: No If all of the above answers are "NO", then may proceed with Cephalosporin use.     Prescriptions Prior to Admission  Medication Sig Dispense Refill Last Dose  . albuterol (PROVENTIL HFA;VENTOLIN HFA) 108 (90 BASE) MCG/ACT inhaler Inhale 2 puffs into the lungs every 4 (four) hours as needed for wheezing or shortness of breath. 1 Inhaler 0 Past Month at Unknown time  . PRESCRIPTION MEDICATION Take 1 tablet by mouth daily. Name unknown   Past Week at Unknown time    ROS:  Vaginal spotting, mild cramping  Physical Exam   Blood pressure 117/76, pulse 75, temperature 98.4 F (36.9 C), temperature source Oral, resp. rate 16, height  (1.549 m), weight 89.4 kg (197 lb), last menstrual period 11/19/2016, SpO2 99 %, unknown if currently breastfeeding.    Physical Exam  In NAD Chest clear Heart RRR without murmur Abd soft, NT Pelvic--small amount dark spotting in vault, no active bleeding.  Cervix closed, long, NT Ext WNL  UPT negative here today  ED Course  Assessment: Likely early pregnancy loss RH positive  Plan: Check QHCG and CBC  GC/chlamydia done on exam.  Nigel Bridgeman CNM, MSN 01/20/2017 11:52 AM   Addendum: Results for orders placed or performed during the hospital encounter of 01/20/17 (from the past 24 hour(s))  Urinalysis, Routine w reflex microscopic     Status: Abnormal   Collection Time: 01/20/17 11:21 AM  Result Value Ref Range   Color, Urine YELLOW YELLOW   APPearance HAZY (A) CLEAR   Specific Gravity, Urine 1.012 1.005 - 1.030   pH 5.0 5.0 - 8.0   Glucose, UA NEGATIVE NEGATIVE mg/dL   Hgb urine dipstick LARGE (A) NEGATIVE   Bilirubin Urine NEGATIVE NEGATIVE   Ketones, ur NEGATIVE NEGATIVE mg/dL   Protein, ur NEGATIVE NEGATIVE mg/dL   Nitrite NEGATIVE NEGATIVE   Leukocytes, UA  TRACE (A) NEGATIVE   RBC / HPF 0-5 0 - 5 RBC/hpf   WBC, UA 0-5 0 - 5 WBC/hpf   Bacteria, UA RARE (A) NONE SEEN   Squamous Epithelial / LPF 6-30 (A) NONE SEEN  Pregnancy, urine POC     Status: None   Collection Time: 01/20/17 11:44 AM  Result Value Ref Range   Preg Test, Ur NEGATIVE NEGATIVE  CBC     Status: None   Collection Time: 01/20/17 11:55 AM  Result Value Ref Range   WBC 10.1 4.0 - 10.5 K/uL   RBC 4.22 3.87 - 5.11 MIL/uL   Hemoglobin 13.1 12.0 - 15.0 g/dL   HCT 16.1 09.6 - 04.5 %   MCV 89.8 78.0 - 100.0 fL   MCH 31.0 26.0 - 34.0 pg   MCHC 34.6 30.0 - 36.0 g/dL   RDW 40.9 81.1 - 91.4 %   Platelets 310 150 - 400 K/uL  hCG, quantitative, pregnancy     Status: None   Collection Time: 01/20/17 11:55 AM  Result Value Ref Range   hCG, Beta Chain, Quant, S <1 <5 mIU/mL   Reviewed negative QHCG with patient and mother. Advised of presumptive SAB, but unable to determine if pregnancy developed beyond just the fertilization stage. Recommended patient repeat home UPT in 2 weeks for confirmation of negative pregnancy status. If negative, no f/u needed. If positive, patient to call [facility]. Advised patient to continue her OCPs as usual.  Upon d/c, patient advised me she has appt at [facility] on 01/25/17 for sporadic pain around previous C/S incision, and for positive pregnancy test at home.  Came to MAU when spotting started this am.  Hx ovarian cyst in past. No evidence or report of that pain today. Advised her to keep the appt next week for evaluation of that issue. Note for patient to be OOW today.  Nigel Bridgeman, CNM 01/20/17 1:15p

## 2017-01-20 NOTE — MAU Note (Signed)
Pt reports she had a positive home preg test and today she went to the restroom today and noticed some bright red bleeding on the tissue. After that she started having lower abd cramping.

## 2017-01-20 NOTE — Discharge Instructions (Signed)
Call/follow up as needed with any issues or questions.  I would recommend a follow-up urine pregnancy test in about 2 weeks.  If negative, no follow-up needed.  If positive, call [facility].

## 2017-01-23 LAB — GC/CHLAMYDIA PROBE AMP (~~LOC~~) NOT AT ARMC
CHLAMYDIA, DNA PROBE: NEGATIVE
NEISSERIA GONORRHEA: NEGATIVE

## 2017-05-21 ENCOUNTER — Emergency Department (HOSPITAL_COMMUNITY)
Admission: EM | Admit: 2017-05-21 | Discharge: 2017-05-21 | Disposition: A | Discharge status: Home / Self Care | Payer: BLUE CROSS/BLUE SHIELD | Attending: Emergency Medicine | Admitting: Emergency Medicine

## 2017-05-21 ENCOUNTER — Encounter (HOSPITAL_COMMUNITY): Payer: Self-pay | Admitting: Emergency Medicine

## 2017-05-21 DIAGNOSIS — G5602 Carpal tunnel syndrome, left upper limb: Secondary | ICD-10-CM

## 2017-05-21 DIAGNOSIS — M25512 Pain in left shoulder: Secondary | ICD-10-CM | POA: Diagnosis not present

## 2017-05-21 DIAGNOSIS — M79602 Pain in left arm: Secondary | ICD-10-CM | POA: Diagnosis present

## 2017-05-21 MED ORDER — NAPROXEN 500 MG PO TABS: 500.0000 mg | ORAL_TABLET | Freq: Two times a day (BID) | ORAL | 0 refills | Status: DC

## 2017-05-21 MED ORDER — ACETAMINOPHEN 325 MG PO TABS
650.0000 mg | ORAL_TABLET | Freq: Once | ORAL | Status: AC
  Administered 2017-05-21: 650 mg via ORAL
  Filled 2017-05-21: qty 2

## 2017-05-21 NOTE — Discharge Instructions (Signed)
Please call and schedule an appointment to follow with orthopedics for further evaluation and treatment. I prescribed you an anti-inflammatory medication: Naproxen that he can take twice daily with food as needed for pain. This medication can upset your stomach so please make sure to take this with food. Do not take this medication with other anti-inflammatory medications like ibuprofen. You can supplement this medication with Tylenol as needed. Please follow [facility] protocol for routine care of injuries as described in the handout attached. I have also provided information on carpal tunnel syndrome. I provided you with a brace while you were in the emergency department. It is important to wear this brace especially at nighttime. If you develop worsening or new concerning symptoms you can return to the emergency department for re-evaluation.

## 2017-05-21 NOTE — ED Triage Notes (Signed)
Pt woke this morning with L shoulder / arm pain radiating down arm. Pt states she has numbness sensation in L hand. Denies injury.

## 2017-05-21 NOTE — ED Notes (Signed)
Pt verbalized understanding of discharge instructions.  Ambulatory and independent at discharge. 

## 2017-05-21 NOTE — Progress Notes (Signed)
Orthopedic Tech Progress Note Patient Details:  Vonzell SchlatterBriana A Krikorian 05-08-1991 [id number]  Ortho Devices Type of Ortho Device: Velcro wrist splint Ortho Device/Splint Location: Lt wrist Ortho Device/Splint Interventions: Application   Clois Dupesvery S Jeralyn Nolden 05/21/2017, 1:14 PM

## 2017-05-21 NOTE — ED Provider Notes (Signed)
[facility]-EMERGENCY DEPT Provider Note   CSN: [id number] Arrival date & time: 05/21/17  1228  By signing my name below, I, [name], attest that this documentation has been prepared under the direction and in the presence of SPX CorporationMichael Leiana Rund, PA-C. Electronically Signed: Diona BrownerJennifer Compton, ED Scribe. 05/21/17. 1:01 PM.  History   Chief Complaint Chief Complaint  Patient presents with  . Arm Pain    HPI [name] is a 26 y.o. female who presents to the Emergency Department complaining of constant, 7/10, sharp, achy, atraumatic left arm pain that started yesterday morning upon awakening, 05/20/17. Pain radiates down her arm and she notes tingling in her left hand.   Movement exacerbates her pain. No recent injuries. 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 No new workout regimens. Pt has a young child (~1) at home who she is constantly picking up. Pt denies fever, neck pain or any other sx at this time. No history of rheumatoid syndromes in self or family memories.   The history is provided by the patient and a parent. No language interpreter was used.    Past Medical History:  Diagnosis Date  . Asthma   . Hyperthyroidism   . Scoliosis   . Scoliosis     Patient Active Problem List   Diagnosis Date Noted  . Status post primary low transverse cesarean section 01/31/2016  . Delayed delivery after SROM (spontaneous rupture of membranes) 01/28/2016  . 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 Allergy to amoxicillin 01/06/2016  . Scoliosis 07/16/2015  . Hyperthyroidism 07/16/2015  . Asthma 07/16/2015  . Sickle cell trait (HCC) 07/16/2015    Past Surgical History:  Procedure Laterality Date  . CESAREAN SECTION N/A 01/29/2016   Procedure: CESAREAN SECTION;  Surgeon: Geryl RankinsEvelyn Varnado, MD;  Location: [facility] OR[facility];  Service: Obstetrics;  Laterality: N/A;    OB  History    Gravida Para Term Preterm AB Living   1 1 1     1    SAB TAB Ectopic Multiple Live Births         0 1       Home Medications    Prior to Admission medications   Medication Sig Start Date End Date Taking? Authorizing Provider  JENCYCLA 0.35 MG tablet Take 1 tablet by mouth at bedtime. 11/25/16   [provider]  naproxen (NAPROSYN) 500 MG tablet Take 1 tablet (500 mg total) by mouth 2 (two) times daily. 05/21/17   Karel Turpen, Elmer SowMichael M, PA-C    Family History Family History  Problem Relation Age of Onset  . Diabetes Mother        type 2  . Hypothyroidism Mother   . Asthma Father   . Asthma Sister   . Hypothyroidism Sister     Social History Social History  Substance Use Topics  . Smoking status: Never Smoker  . Smokeless tobacco: Never Used  . Alcohol use No     Allergies   Amoxicillin   Review of Systems Review of Systems  Constitutional: Negative for fever.  Musculoskeletal: Positive for arthralgias. Negative for neck pain.  Skin: Negative for rash.  Neurological: Negative for weakness.     Physical Exam Updated Vital Signs BP 119/84 (BP Location: Right Arm)   Pulse 92   Temp 99.2 F (37.3 C) (Oral)   Resp 16   LMP 05/01/2017 (Exact Date)  SpO2 97%   Physical Exam  Constitutional: She appears well-developed and well-nourished.  HENT:  Head: Normocephalic and atraumatic.  Right Ear: External ear normal.  Left Ear: External ear normal.  Mouth/Throat: Oropharynx is clear and moist.  Eyes: Conjunctivae are normal. Right eye exhibits no discharge. Left eye exhibits no discharge. No scleral icterus.  Neck: Trachea normal, normal range of motion and full passive range of motion without pain. Neck supple. No spinous process tenderness and no muscular tenderness present. Normal range of motion present.  Pulmonary/Chest: Effort normal. No respiratory distress.  Musculoskeletal:       Left elbow: Normal.       Cervical back: Normal.  No  cervical spine step off. No cervical spine tenderness. Negative Spurlings test.    Left shoulder: No bony tenderness of clavicle, AC joint or scapular spine. FROM flexion, abduction and external and internal rotation. Age appropriate strength for flexion, abduction, external and internal. Negative Neers test. Mildly positive hawkins test. Negative adson's test. ROM, as well as grip strength.   Left hand: No gross deformities, skin intact. Fingers appear normal. No TTP over flexor sheath. Radial/Ulnar arteries 2+ with <2sec cap refill. SILT in M/U/R distributions. Dynamic 2-pt discrimination intact over median and ulnar nerve distributions on the ulnar/radial aspects of digits. Sharp/dull sensation intact at dorsal 1st webspace. Grip 5/5 strength. MCP flexion/extension intact. Finger adduction/abduction intact with 5/5 strength.  Thumb opposition intact. Full ROM to Flexion/Extension at wrist, MCP, PIP and DIP.  + tinel's test. + phalens test. No thenar eminence atrophy noted.  Lymphadenopathy:    She has no cervical adenopathy.  Neurological: She is alert. She has normal strength. No sensory deficit.  Skin: No pallor.  Psychiatric: She has a normal mood and affect.  Nursing note and vitals reviewed.    ED Treatments / Results  DIAGNOSTIC STUDIES: Oxygen Saturation is 97% on RA, normal by my interpretation.   COORDINATION OF CARE: 1:01 PM-Discussed next steps with pt follow up with orthopaedist. Pt is to be careful picking up her child the next few days. She is to follow up with her OB doctor if her sx don't improve. Pt verbalized understanding and is agreeable with the plan.   Labs (all labs ordered are listed, but only abnormal results are displayed) Labs Reviewed - No data to display  EKG  EKG Interpretation None       Radiology No results found.  Procedures Procedures (including critical care time)  Medications Ordered in ED Medications  acetaminophen (TYLENOL) tablet 650  mg (650 mg Oral Given 05/21/17 1305)     Impression / Assessment and Plan / ED Course  I have reviewed the triage vital signs and the nursing notes.  Pertinent labs & imaging results that were available during my care of the patient were reviewed by me and considered in my medical decision making (see chart for details).     26 year old female with atraumatic left shoulder pain. Pain managed in the ED. Patient is without heat, erythema, swelling or decrease rom. Afebrile on presentation. Do not suspect septic joint. Negative Spurling test. Do not suspect nerve root compression. Negative Adson test, do not believe this is thoracic outlet syndrome. Patient does have noted Tinel and Phalen's test suggestive of carpal tunnel syndrome. Will give her wrist splint and follow up with ortho. There is mildly positive hawkins test on left shoulder exam. Negative Neer's. Will treat the patient with RICE and conservative therapy. Xray offered but patient denied at this  time.  I advised the patient to return to the emergency department with new or worsening symptoms or new concerns. Specific return precautions discussed. The patient verbalized understanding and agreement with plan. All questions answered. No further questions at this time. The patient appears safe for discharge.  Final Clinical Impressions(s) / ED Diagnoses   Final diagnoses:  Carpal tunnel syndrome of left wrist  Acute pain of left shoulder    New Prescriptions Discharge Medication List as of 05/21/2017  1:10 PM    START taking these medications   Details  naproxen (NAPROSYN) 500 MG tablet Take 1 tablet (500 mg total) by mouth 2 (two) times daily., Starting Sun 05/21/2017, Print       I personally performed the services described in this documentation, which was scribed in my presence. The recorded information has been reviewed and is accurate.       Jacinto HalimMaczis, Kiona Blume M, PA-C 05/21/17 1637    Derwood KaplanNanavati, Ankit, MD 05/22/17 0800

## 2017-06-07 IMAGING — US US OB COMP LESS 14 WK
1 series · 15 of 28 positions shown · non-contrast
Comparison: None.

CLINICAL DATA: Pelvic pain left lower quadrant

EXAM:
OBSTETRIC <14 WK ULTRASOUND
TECHNIQUE: Transabdominal ultrasound was performed for evaluation of the
gestation as well as the maternal uterus and adnexal regions.

[Series 1: us ob comp less 14 wk · 35 acquisitions, 15 frames shown]
[im 1/35]
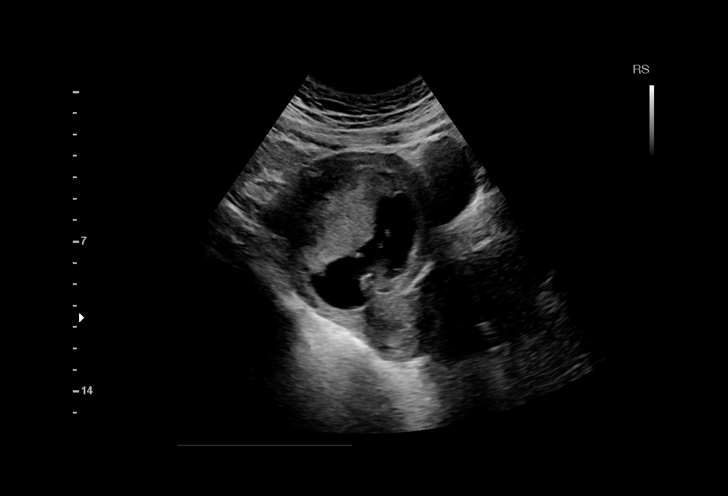
[im 3/35]
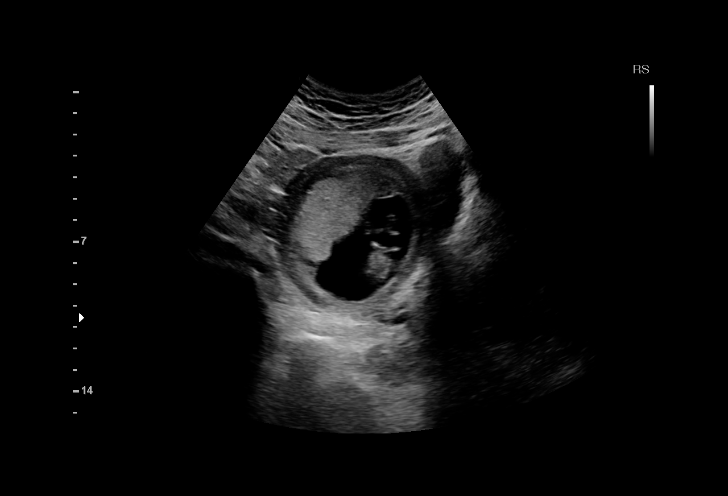
[im 6/35]
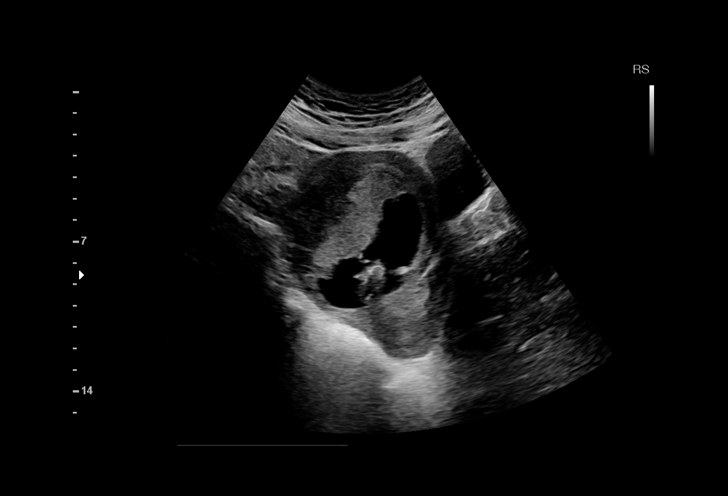
[im 8/35]
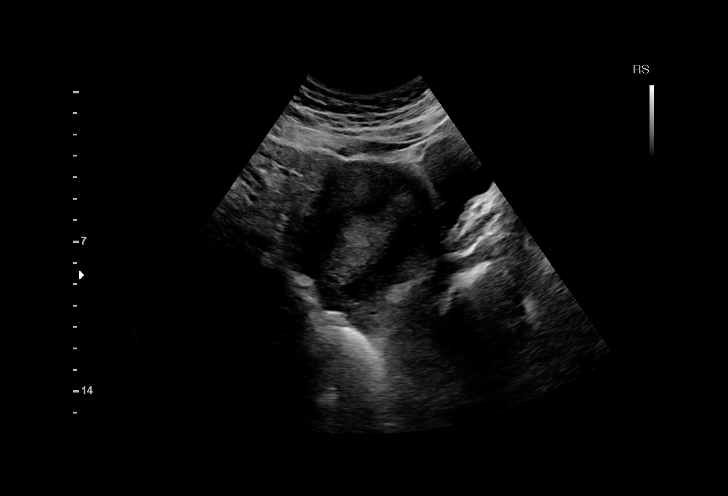
[im 11/35]
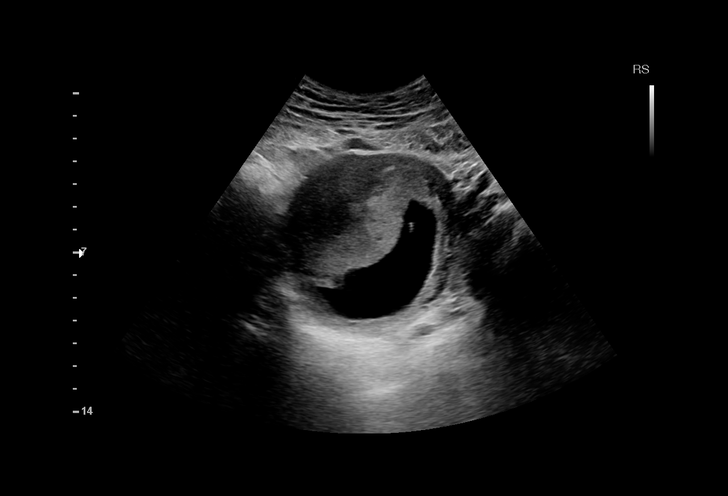
[im 13/35]
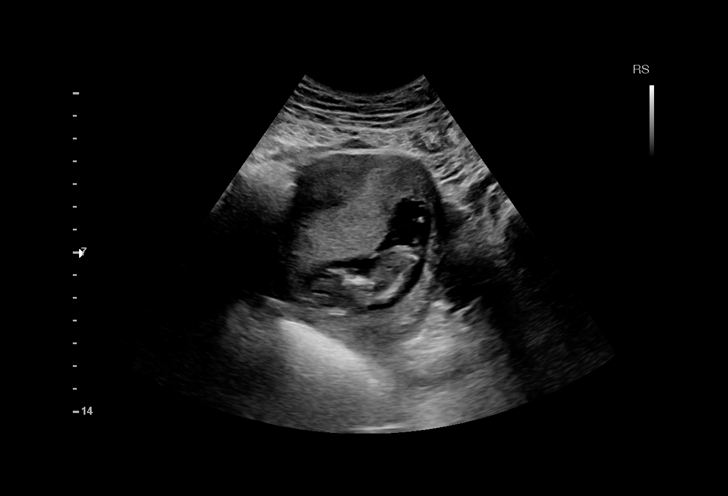
[im 16/35]
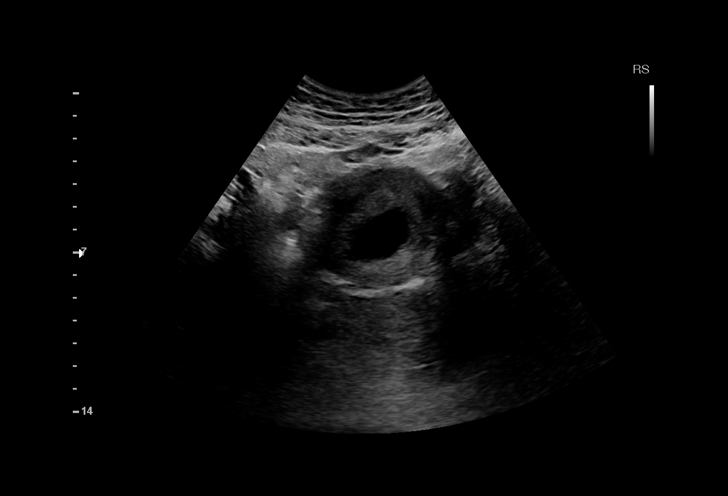
[im 18/35]
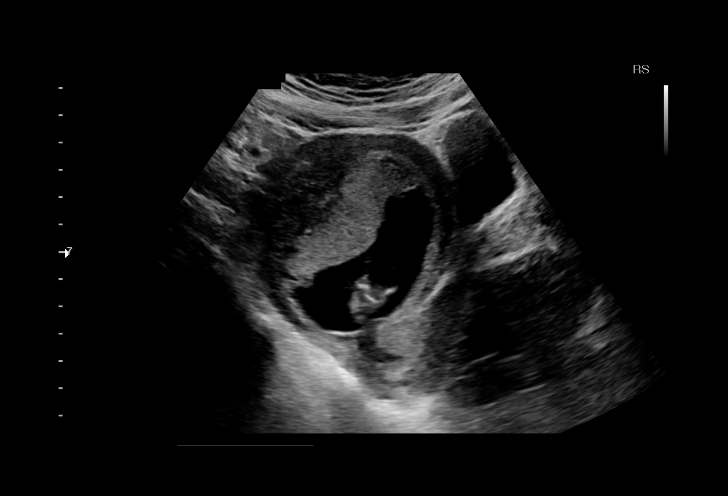
[im 19/35]
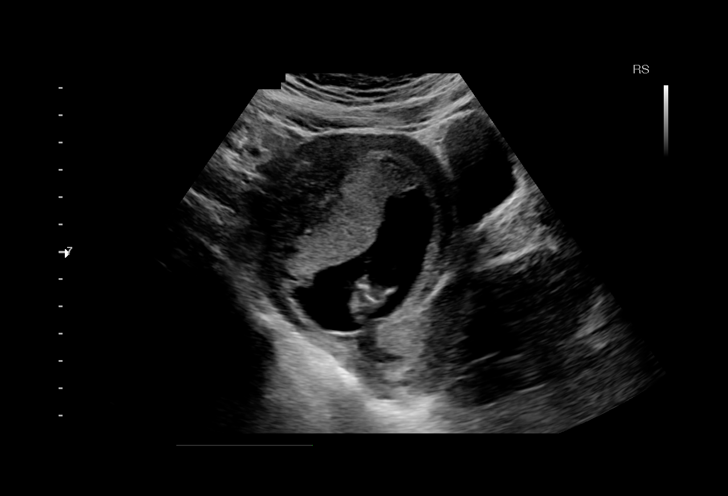
[im 22/35]
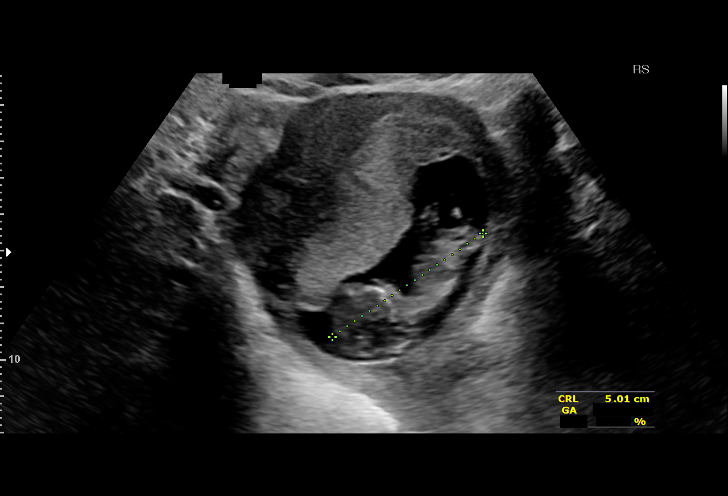
[im 24/35]
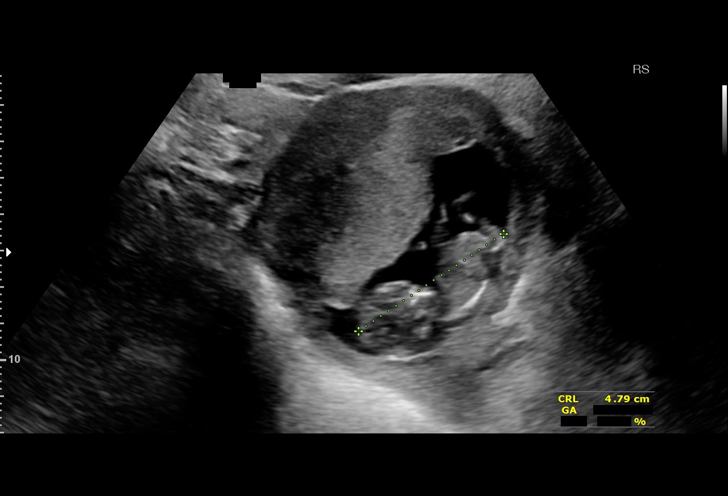
[im 27/35]
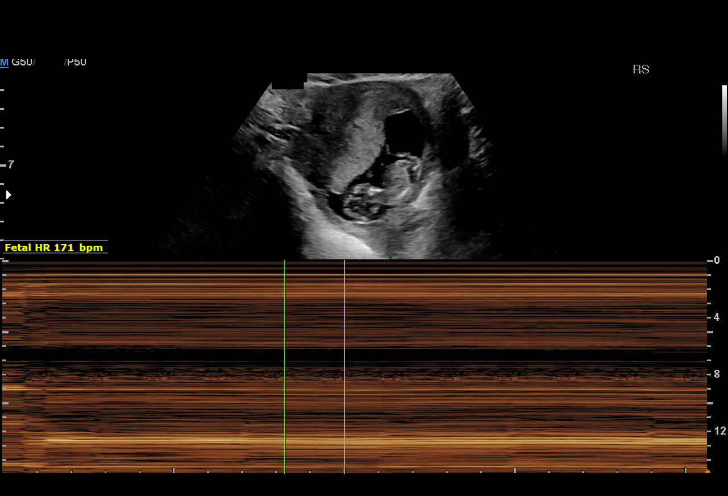
[im 29/35]
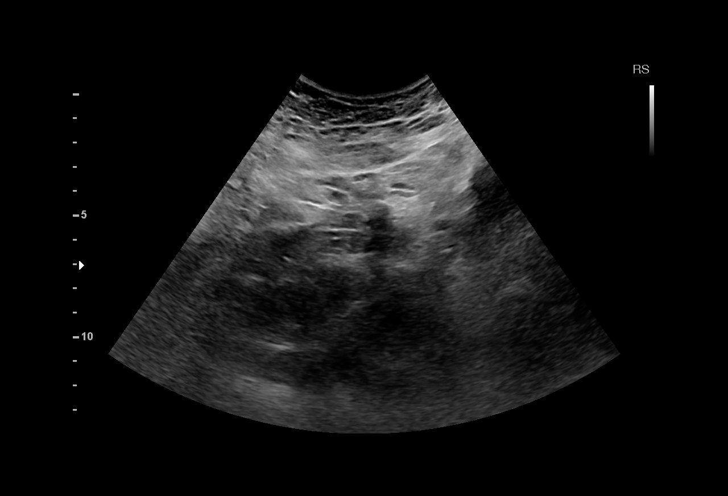
[im 32/35]
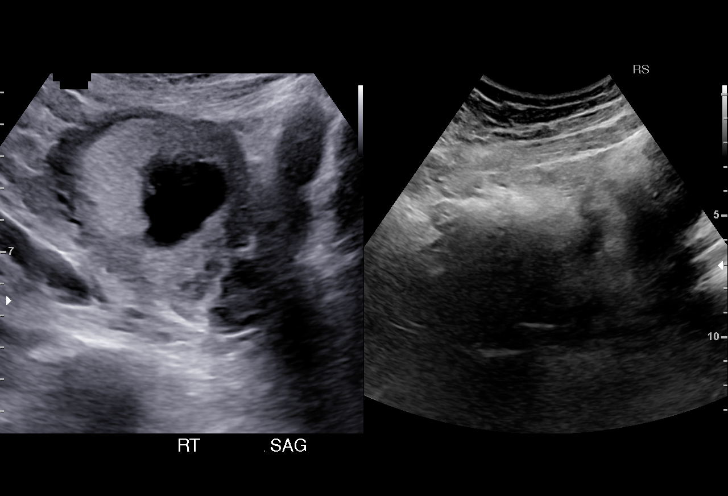
[im 35/35]
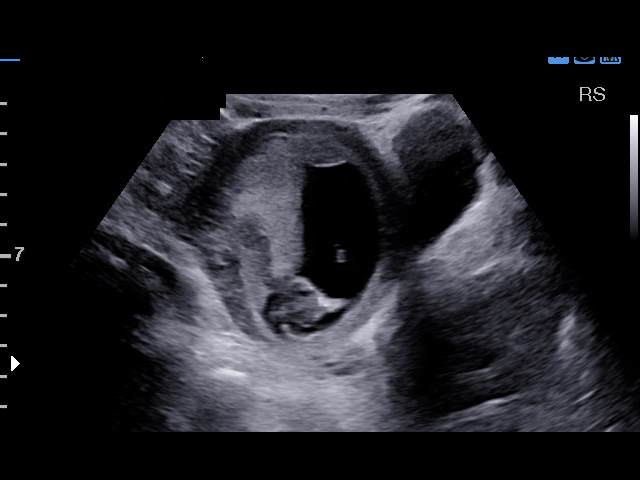

[15 of 28 positions shown; findings below may reference images not displayed]

FINDINGS: Intrauterine gestational sac: Visualized/normal in shape.

Yolk sac:  Not visualized

Embryo:  Visualized

Cardiac Activity: Visualized

Heart Rate: 171 bpm

CRL:   49  mm   11 w 5d                  US EDC: 01/30/16

Maternal uterus/adnexae: Left ovary appears normal. Right ovary not
visualized, but no adnexal mass is seen. No free fluid.
IMPRESSION: Single live intrauterine gestation measuring 11 weeks 5 days which
is concordant with assigned gestational age by LMP of 11 weeks 3
days. No acute abnormality.

## 2017-06-13 ENCOUNTER — Emergency Department (HOSPITAL_COMMUNITY): Payer: BLUE CROSS/BLUE SHIELD

## 2017-06-13 ENCOUNTER — Encounter (HOSPITAL_COMMUNITY): Payer: Self-pay | Admitting: Emergency Medicine

## 2017-06-13 ENCOUNTER — Emergency Department (HOSPITAL_COMMUNITY)
Admission: EM | Admit: 2017-06-13 | Discharge: 2017-06-13 | Disposition: A | Discharge status: Home / Self Care | Payer: BLUE CROSS/BLUE SHIELD | Attending: Emergency Medicine | Admitting: Emergency Medicine

## 2017-06-13 DIAGNOSIS — R0789 Other chest pain: Secondary | ICD-10-CM | POA: Insufficient documentation

## 2017-06-13 DIAGNOSIS — Z79899 Other long term (current) drug therapy: Secondary | ICD-10-CM | POA: Diagnosis not present

## 2017-06-13 DIAGNOSIS — J45909 Unspecified asthma, uncomplicated: Secondary | ICD-10-CM | POA: Insufficient documentation

## 2017-06-13 LAB — BASIC METABOLIC PANEL
ANION GAP: 6 (ref 5–15)
BUN: 11 mg/dL (ref 6–20)
CALCIUM: 9 mg/dL (ref 8.9–10.3)
CO2: 26 mmol/L (ref 22–32)
Chloride: 107 mmol/L (ref 101–111)
Creatinine, Ser: 0.89 mg/dL (ref 0.44–1.00)
GFR calc Af Amer: >60 mL/min (ref >60)
GFR calc non Af Amer: >60 mL/min (ref >60)
GLUCOSE: 93 mg/dL (ref 65–99)
POTASSIUM: 4.1 mmol/L (ref 3.5–5.1)
SODIUM: 139 mmol/L (ref 135–145)

## 2017-06-13 LAB — POCT I-STAT TROPONIN I
TROPONIN I, POC: 0 ng/mL (ref 0.00–0.08)
Troponin i, poc: 0 ng/mL (ref 0.00–0.08)

## 2017-06-13 LAB — CBC
HEMATOCRIT: 40.1 % (ref 36.0–46.0)
HEMOGLOBIN: 13.8 g/dL (ref 12.0–15.0)
MCH: 30.7 pg (ref 26.0–34.0)
MCHC: 34.4 g/dL (ref 30.0–36.0)
MCV: 89.1 fL (ref 78.0–100.0)
Platelets: 311 10*3/uL (ref 150–400)
RBC: 4.5 MIL/uL (ref 3.87–5.11)
RDW: 14.4 % (ref 11.5–15.5)
WBC: 10.1 10*3/uL (ref 4.0–10.5)

## 2017-06-13 LAB — D-DIMER, QUANTITATIVE (NOT AT ARMC): D DIMER QUANT: 0.42 ug{FEU}/mL (ref 0.00–0.50)

## 2017-06-13 LAB — POCT PREGNANCY, URINE: Preg Test, Ur: NEGATIVE

## 2017-06-13 NOTE — ED Notes (Signed)
Called for vital recheck... No answer 

## 2017-06-13 NOTE — ED Provider Notes (Signed)
[facility]-EMERGENCY DEPT Provider Note   CSN: [id number] Arrival date & time: 06/13/17  1116     History   Chief Complaint Chief Complaint  Patient presents with  . Chest Pain  . Arm Pain    HPI [name] is a 26 y.o. female.  The history is provided by the patient.  Chest Pain   This is a new problem. The current episode started 2 days ago. Episode frequency: Patient reports that the pain was constant for 7 hours yesterday and resolved spontaneously at night. Noted that he returned this morning and has been intermittent since. Progression since onset: Fluctuating. The pain is associated with exertion and rest. The pain is present in the substernal region. The pain is moderate. The quality of the pain is described as pressure-like, sharp and pleuritic. The pain radiates to the left shoulder and left arm. The symptoms are aggravated by deep breathing and exertion. Associated symptoms include malaise/fatigue. Pertinent negatives include no abdominal pain, no back pain, no claudication, no cough, no diaphoresis, no fever, no headaches, no hemoptysis, no irregular heartbeat, no leg pain, no lower extremity edema, no nausea, no shortness of breath and no vomiting. She has tried rest for the symptoms. The treatment provided mild relief. Risk factors include obesity.  Her past medical history is significant for thyroid problem.  Pertinent negatives for past medical history include no CAD, no cancer, no COPD, no diabetes, no DVT, no hyperlipidemia, no hypertension, no MI, no PE and no TIA.  Pertinent negatives for family medical history include: no early MI.  Procedure history is negative for exercise treadmill test.   No recent travels or surgeries, patient is on progestin OCP.   Past Medical History:  Diagnosis Date  . Asthma   . Hyperthyroidism   . Scoliosis   . Scoliosis     Patient Active Problem List   Diagnosis Date Noted  . Status post primary low transverse cesarean section  01/31/2016  . Delayed delivery after SROM (spontaneous rupture of membranes) 01/28/2016  . 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 Allergy to amoxicillin 01/06/2016  . Scoliosis 07/16/2015  . Hyperthyroidism 07/16/2015  . Asthma 07/16/2015  . Sickle cell trait (HCC) 07/16/2015    Past Surgical History:  Procedure Laterality Date  . CESAREAN SECTION N/A 01/29/2016   Procedure: CESAREAN SECTION;  Surgeon: Geryl Rankins, MD;  Location: [facility];  Service: Obstetrics;  Laterality: N/A;    OB History    Gravida Para Term Preterm AB Living   1 1 1     1    SAB TAB Ectopic Multiple Live Births         0 1       Home Medications    Prior to Admission medications   Medication Sig Start Date End Date Taking? Authorizing Provider  acetaminophen (TYLENOL) 325 MG tablet Take 650 mg by mouth every 6 (six) hours as needed for mild pain or headache.   Yes [provider]  JENCYCLA 0.35 MG tablet Take 1 tablet by mouth at bedtime. 11/25/16  Yes [provider]  naproxen (NAPROSYN) 500 MG tablet Take 1 tablet (500 mg total) by mouth 2 (two) times daily. Patient not taking: Reported on 06/13/2017 05/21/17   Jacinto Halim, PA-C    Family History Family History  Problem Relation Age of Onset  . Diabetes Mother        type 2  . Hypothyroidism Mother   . Asthma Father   .  Asthma Sister   . Hypothyroidism Sister     Social History Social History  Substance Use Topics  . Smoking status: Never Smoker  . Smokeless tobacco: Never Used  . Alcohol use No     Allergies   Amoxicillin   Review of Systems Review of Systems  Constitutional: Positive for malaise/fatigue. Negative for diaphoresis and fever.  Respiratory: Negative for cough, hemoptysis and shortness of breath.   Cardiovascular: Positive for chest pain. Negative for claudication.  Gastrointestinal: Negative for abdominal pain, nausea and vomiting.  Musculoskeletal: Negative  for back pain.  Neurological: Negative for headaches.   All other systems are reviewed and are negative for acute change except as noted in the HPI   Physical Exam Updated Vital Signs BP 123/79 (BP Location: Right Arm)   Pulse 69   Temp 98.3 F (36.8 C) (Oral)   Resp 16   LMP 05/01/2017   SpO2 99%   Physical Exam  Constitutional: She is oriented to person, place, and time. She appears well-developed and well-nourished. No distress.  HENT:  Head: Normocephalic and atraumatic.  Nose: Nose normal.  Eyes: Pupils are equal, round, and reactive to light. Conjunctivae and EOM are normal. Right eye exhibits no discharge. Left eye exhibits no discharge. No scleral icterus.  Neck: Normal range of motion. Neck supple.  Cardiovascular: Normal rate and regular rhythm.  Exam reveals no gallop and no friction rub.   No murmur heard. Pulmonary/Chest: Effort normal and breath sounds normal. No stridor. No respiratory distress. She has no rales. She exhibits tenderness (mild chest wall discomfort to palpation).    Abdominal: Soft. She exhibits no distension. There is no tenderness.  Musculoskeletal: She exhibits no edema.       Left upper arm: She exhibits tenderness (mild discomfort to palpation). She exhibits no bony tenderness, no swelling and no edema.  Neurological: She is alert and oriented to person, place, and time.  Skin: Skin is warm and dry. No rash noted. She is not diaphoretic. No erythema.  Psychiatric: She has a normal mood and affect.  Vitals reviewed.    ED Treatments / Results  Labs (all labs ordered are listed, but only abnormal results are displayed) Labs Reviewed  BASIC METABOLIC PANEL  CBC  D-DIMER, QUANTITATIVE (NOT AT [facility])  I-STAT TROPONIN, ED  POCT I-STAT TROPONIN I  POCT PREGNANCY, URINE  POCT I-STAT TROPONIN I    EKG  EKG Interpretation  Date/Time:  Tuesday June 13 2017 11:22:30 EDT Ventricular Rate:  68 PR Interval:    QRS Duration: 84 QT  Interval:  359 QTC Calculation: 382 R Axis:   63 Text Interpretation:  Sinus rhythm NO STEMI No significant change since last tracing Confirmed by Drema Pry [phone]) on 06/13/2017 5:30:32 PM       Radiology Dg Chest 2 View  Result Date: 06/13/2017 CLINICAL DATA:  Mid and left-sided chest pain since yesterday without associated symptoms. No history of asthma. Nonsmoker. EXAM: CHEST  2 VIEW COMPARISON:  PA and lateral chest x-ray of September 11, 2013 FINDINGS: The lungs are adequately inflated. The interstitial markings are mildly prominent. There is no alveolar infiltrate. There is no pleural effusion. The heart and pulmonary vascularity are normal. The trachea is midline. There is gentle curvature centered in the midthoracic spine convex toward the right. The bony thorax exhibits no acute abnormality. IMPRESSION: No acute cardiopulmonary abnormality. 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 Electronically Signed   By: David  [place] M.D.  On: 06/13/2017 11:47    Procedures Procedures (including critical care time)  Medications Ordered in ED Medications - No data to display   Initial Impression / Assessment and Plan / ED Course  I have reviewed the triage vital signs and the nursing notes.  Pertinent labs & imaging results that were available during my care of the patient were reviewed by me and considered in my medical decision making (see chart for details).     Substernal, exertional, pleuritic chest pain. EKG without acute ischemic changes or evidence of pericarditis. Initial troponin negative. HEART <4. Dr. troponin negative. Effectively ruling out ACS in this low risk chest pain patient.  Low pretest probability for pulmonary embolism however unable to [facility]. Dimer negative.doubt PE.  Presentation a classic for aortic dissection or esophageal perforation. Chest x-ray without evidence suggestive of pneumonia, pneumothorax, pneumomediastinum.  No  abnormal contour of the mediastinum to suggest dissection. No evidence of acute injuries.  The patient is safe for discharge with strict return precautions.   Final Clinical Impressions(s) / ED Diagnoses   Final diagnoses:  Atypical chest pain   Disposition: Discharge  Condition: Good  I have discussed the results, Dx and Tx plan with the patient who expressed understanding and agree(s) with the plan. Discharge instructions discussed at great length. The patient was given strict return precautions who verbalized understanding of the instructions. No further questions at time of discharge.    New Prescriptions   No medications on file    Follow Up: Jaymes Graff, MD [address] [place] [address] [phone]  Schedule an appointment as soon as possible for a visit  As needed      Cardama, Amadeo Garnet, MD 06/13/17 1924

## 2017-06-13 NOTE — ED Notes (Signed)
Pt had drawn for labs:  Red Blue Gold Lt green Dark green Lavender

## 2017-06-13 NOTE — ED Triage Notes (Signed)
Pt c/o left sharp achy chest pain, dizziness, and symmetrical bilateral weakness sudden onset yesterday, resolved spontaneously, returned this morning along with left arm pain from mid upper arm to mid forearm. No SOB, nausea.

## 2017-10-30 ENCOUNTER — Encounter (HOSPITAL_COMMUNITY): Payer: Self-pay | Admitting: *Deleted

## 2017-10-30 ENCOUNTER — Inpatient Hospital Stay (HOSPITAL_COMMUNITY)
Admission: AD | Admit: 2017-10-30 | Discharge: 2017-10-30 | Disposition: A | Discharge status: Home / Self Care | Payer: Managed Care, Other (non HMO) | Source: Ambulatory Visit | Attending: Obstetrics and Gynecology | Admitting: Obstetrics and Gynecology

## 2017-10-30 DIAGNOSIS — N912 Amenorrhea, unspecified: Secondary | ICD-10-CM | POA: Insufficient documentation

## 2017-10-30 DIAGNOSIS — R109 Unspecified abdominal pain: Secondary | ICD-10-CM

## 2017-10-30 LAB — URINALYSIS, ROUTINE W REFLEX MICROSCOPIC
BILIRUBIN URINE: NEGATIVE
GLUCOSE, UA: NEGATIVE mg/dL
HGB URINE DIPSTICK: NEGATIVE
Ketones, ur: NEGATIVE mg/dL
Leukocytes, UA: NEGATIVE
Nitrite: NEGATIVE
PROTEIN: NEGATIVE mg/dL
Specific Gravity, Urine: 1.012 (ref 1.005–1.030)
pH: 5 (ref 5.0–8.0)

## 2017-10-30 LAB — HCG, SERUM, QUALITATIVE: Preg, Serum: NEGATIVE

## 2017-10-30 LAB — POCT PREGNANCY, URINE: Preg Test, Ur: NEGATIVE

## 2017-10-30 NOTE — Discharge Instructions (Signed)
[facility]  ° ° °[facility]       Phone: [phone] ° °[facility] Phone: [phone] ° °[facility]   Phone: [phone] ° °[facility] [place]  Phone: [phone] ° °[facility] [place]  Phone: [phone] ° °[facility]       Phone: [phone] ° °[facility]    Phone: [phone]  ° °[facility]    Phone: [phone] ° °[facility]    Phone: [phone] ° °[facility]    Phone: [phone] ° °[facility]    Phone: [phone] ° °[facility]       Phone: [phone]  ° °[facility]  Phone: [phone] ° °[facility]    Phone: [phone] ° °[facility]   Phone: [phone] ° °[facility]      Phone: [phone] ° °[facility]    Phone: [phone] ° °~~~~~~~~~~~~~~~~~~~~~~~~~~~~~~~~~~~~~~~~~~~~~~~~~~~~~~~~~~~~~~~~~~~~~~~~~~~~~~~~~~~~~~~~~~~~~~~~~~~~~~~ ° °In late 2019, the [facility] will be moving to the [facility]. At that time, the MAU (Maternity Admissions Unit), where you are being seen today, will no longer take care of non-pregnant patients. We strongly encourage you to find a doctor's office before that time, so that you can be seen with any GYN concerns, like vaginal discharge, urinary tract infection, etc.. in a timely manner. ° °In order to make an office visit more convenient, the [facility] will be offering evening hours with same-day appointments, walk-in appointments and scheduled appointments available during this time. ° °[facility] Hours: °Monday - 8am -  7:30 pm with walk-in between 4pm- 7:30 pm °Tuesday - 8 am - 5 pm (starting 01/16/18 we will be open late and accepting walk-ins from 4pm - 7:30pm) °Wednesday - 8 am - 5 pm (starting 04/18/18 we will be open late and accepting walk-ins from 4pm - 7:30pm) °Thursday 8 am - 5 pm (starting 07/19/18 we will be open late and accepting walk-ins from 4pm - 7:30pm) °Friday 8 am - 5 pm ° °For an appointment please call the [facility] at [phone] ° °For urgent needs, [facility] is also available for management of urgent GYN complaints such as vaginal discharge or urinary tract infections. ° ° ° ° ° °

## 2017-10-30 NOTE — MAU Provider Note (Signed)
[name] is a 27 y.o. G1P1001 presents to MAU today for pregnancy test d/t amenorrhea since 11/112018 and pregnancy sx's. The patient denies abdominal pain or vaginal bleeding today.   BP 128/76 (BP Location: Right Arm)   Pulse 91   Temp 98.5 F (36.9 C) (Oral)   Resp 18   Ht 5\' 1"  (1.549 m)   Wt 216 lb (98 kg)   LMP 08/27/2017   BMI 40.81 kg/m   CONSTITUTIONAL: Well-developed, well-nourished female in no acute distress.  MUSCULOSKELETAL: Normal range of motion.  CARDIOVASCULAR: Regular heart rate RESPIRATORY: Normal effort NEUROLOGICAL: Alert and oriented to person, place, and time.  SKIN: No pallor. PSYCH: Normal mood and affect. Normal behavior. Normal judgment and thought content.  Results for orders placed or performed during the hospital encounter of 10/30/17 (from the past 24 hour(s))  Urinalysis, Routine w reflex microscopic     Status: None   Collection Time: 10/30/17  1:32 PM  Result Value Ref Range   Color, Urine YELLOW YELLOW   APPearance CLEAR CLEAR   Specific Gravity, Urine 1.012 1.005 - 1.030   pH 5.0 5.0 - 8.0   Glucose, UA NEGATIVE NEGATIVE mg/dL   Hgb urine dipstick NEGATIVE NEGATIVE   Bilirubin Urine NEGATIVE NEGATIVE   Ketones, ur NEGATIVE NEGATIVE mg/dL   Protein, ur NEGATIVE NEGATIVE mg/dL   Nitrite NEGATIVE NEGATIVE   Leukocytes, UA NEGATIVE NEGATIVE  Pregnancy, urine POC     Status: None   Collection Time: 10/30/17  2:10 PM  Result Value Ref Range   Preg Test, Ur NEGATIVE NEGATIVE  hCG, serum, qualitative     Status: None   Collection Time: 10/30/17  2:36 PM  Result Value Ref Range   Preg, Serum NEGATIVE NEGATIVE    MDM Patient advised that without concerning symptoms today and a negative UPT and serum pregnancy test there is not medical reason to perform any further testing to investigate amenorrhea in MAU. Advised to f/u with GYN provider; list provided.  A: Amenorrhea  P: Discharge home Advised to f/u with GYN provider; list  provided. Patient may return to MAU as needed or if her condition were to change or worsen   Raelyn MoraDawson, Ashkan Chamberland, CNM 10/30/2017 3:50 PM

## 2017-10-30 NOTE — MAU Note (Signed)
Pt took HPT a week ago, was positive.  Had a negative one yesterday.  C/O lower abd cramping for two weeks, worse today.  Denies bleeding.

## 2018-02-19 ENCOUNTER — Inpatient Hospital Stay (HOSPITAL_COMMUNITY)
Admission: AD | Admit: 2018-02-19 | Discharge: 2018-02-19 | Disposition: A | Discharge status: Home / Self Care | Payer: Managed Care, Other (non HMO) | Source: Ambulatory Visit | Attending: Obstetrics and Gynecology | Admitting: Obstetrics and Gynecology

## 2018-02-19 ENCOUNTER — Encounter (HOSPITAL_COMMUNITY): Payer: Self-pay | Admitting: *Deleted

## 2018-02-19 DIAGNOSIS — Z3202 Encounter for pregnancy test, result negative: Secondary | ICD-10-CM | POA: Diagnosis not present

## 2018-02-19 DIAGNOSIS — N921 Excessive and frequent menstruation with irregular cycle: Secondary | ICD-10-CM

## 2018-02-19 DIAGNOSIS — N939 Abnormal uterine and vaginal bleeding, unspecified: Secondary | ICD-10-CM | POA: Diagnosis not present

## 2018-02-19 DIAGNOSIS — R102 Pelvic and perineal pain: Secondary | ICD-10-CM | POA: Diagnosis not present

## 2018-02-19 DIAGNOSIS — R109 Unspecified abdominal pain: Secondary | ICD-10-CM | POA: Diagnosis present

## 2018-02-19 DIAGNOSIS — Z880 Allergy status to penicillin: Secondary | ICD-10-CM | POA: Diagnosis not present

## 2018-02-19 HISTORY — DX: Polycystic ovarian syndrome: E28.2

## 2018-02-19 LAB — URINALYSIS, ROUTINE W REFLEX MICROSCOPIC
Bilirubin Urine: NEGATIVE
Glucose, UA: NEGATIVE mg/dL
Ketones, ur: NEGATIVE mg/dL
LEUKOCYTES UA: NEGATIVE
Nitrite: NEGATIVE
PH: 5 (ref 5.0–8.0)
Protein, ur: NEGATIVE mg/dL
SPECIFIC GRAVITY, URINE: 1.011 (ref 1.005–1.030)

## 2018-02-19 LAB — WET PREP, GENITAL
SPERM: NONE SEEN
TRICH WET PREP: NONE SEEN
Yeast Wet Prep HPF POC: NONE SEEN

## 2018-02-19 LAB — CBC
HEMATOCRIT: 38.3 % (ref 36.0–46.0)
HEMOGLOBIN: 13.4 g/dL (ref 12.0–15.0)
MCH: 31.2 pg (ref 26.0–34.0)
MCHC: 35 g/dL (ref 30.0–36.0)
MCV: 89.3 fL (ref 78.0–100.0)
Platelets: 298 10*3/uL (ref 150–400)
RBC: 4.29 MIL/uL (ref 3.87–5.11)
RDW: 13.8 % (ref 11.5–15.5)
WBC: 9.3 10*3/uL (ref 4.0–10.5)

## 2018-02-19 LAB — POCT PREGNANCY, URINE: Preg Test, Ur: NEGATIVE

## 2018-02-19 LAB — HCG, QUANTITATIVE, PREGNANCY

## 2018-02-19 MED ORDER — IBUPROFEN 600 MG PO TABS: 600.0000 mg | ORAL_TABLET | Freq: Four times a day (QID) | ORAL | 0 refills | Status: AC | PRN

## 2018-02-19 NOTE — MAU Note (Signed)
Pt reports she has had abd cramping 1.5 weeks. Started having vag bleeding Saturday.  Positive HPT Friday.

## 2018-02-19 NOTE — MAU Provider Note (Signed)
History     CSN: [id number]  Arrival date and time: 02/19/18 1000   First Provider Initiated Contact with Patient 02/19/18 1041      Chief Complaint  Patient presents with  . Abdominal Pain  . Back Pain  . Vaginal Bleeding   26 y.o. Female here with cramping, bleeding, and positive HPT. Reports cramping started 1.5 weeks ago. Describes as intermittent, "like period cramps", center of lower abdomen. Took Tylenol last night, only helped for 10 min. Denies urinary sx. No fevers. No N/V. Bleeding started 2 days ago. At first was only when she wiped, now is a little more but only staining pads. She took a pregnancy test this am and was positive. She is sexually active. No new partner, no concern for STIs. She is using Microgestin for contraception and PCOS. Pt was concerned that her sx were indicative of a miscarriage.  OB History    Gravida  2   Para  1   Term  1   Preterm      AB  1   Living  1     SAB  1   TAB      Ectopic      Multiple  0   Live Births  1           Past Medical History:  Diagnosis Date  . Asthma   . Hyperthyroidism   . PCOS (polycystic ovarian syndrome)   . Scoliosis   . Scoliosis     Past Surgical History:  Procedure Laterality Date  . CESAREAN SECTION N/A 01/29/2016   Procedure: CESAREAN SECTION;  Surgeon: Geryl Rankins, MD;  Location: [facility];  Service: Obstetrics;  Laterality: N/A;    Family History  Problem Relation Age of Onset  . Diabetes Mother        type 2  . Hypothyroidism Mother   . Asthma Father   . Asthma Sister   . Hypothyroidism Sister     Social History   Tobacco Use  . Smoking status: Never Smoker  . Smokeless tobacco: Never Used  Substance Use Topics  . Alcohol use: No  . Drug use: No    Allergies:  Allergies  Allergen Reactions  . Amoxicillin Hives and Nausea And Vomiting    Has patient had a PCN reaction causing immediate rash, facial/tongue/throat swelling, SOB or lightheadedness with  hypotension: Yes Has patient had a PCN reaction causing severe rash involving mucus membranes or skin necrosis: No Has patient had a PCN reaction that required hospitalization No Has patient had a PCN reaction occurring within the last 10 years: No If all of the above answers are "NO", then may proceed with Cephalosporin use.     No medications prior to admission.    Review of Systems  Constitutional: Negative for chills and fever.  Gastrointestinal: Positive for abdominal pain. Negative for constipation, diarrhea, nausea and vomiting.  Genitourinary: Positive for vaginal bleeding. Negative for dysuria, urgency and vaginal discharge.  Musculoskeletal: Positive for back pain.   Physical Exam   Blood pressure 135/74, pulse 76, temperature 98.9 F (37.2 C), temperature source Oral, height  (1.549 m), weight 212 lb (96.2 kg), last menstrual period 01/31/2018, unknown if currently breastfeeding.  Physical Exam  Constitutional: She is oriented to person, place, and time. She appears well-developed and well-nourished. No distress.  HENT:  Head: Normocephalic and atraumatic.  Neck: Normal range of motion.  Cardiovascular: Normal rate.  Respiratory: Effort normal. No respiratory distress.  GI: Soft.  She exhibits no distension and no mass. There is no tenderness. There is no rebound, no guarding and no CVA tenderness.  Genitourinary:  Genitourinary Comments: External: no lesions or erythema Vagina: rugated, pink, moist, small drk red bloody discharge Uterus: non enlarged, anteverted, non tender, no CMT Adnexae: no masses, no tenderness left, no tenderness right Cervix nml   Musculoskeletal: Normal range of motion.       Cervical back: Normal.       Thoracic back: Normal.       Lumbar back: Normal.  Neurological: She is alert and oriented to person, place, and time.  Skin: Skin is warm and dry.  Psychiatric: She has a normal mood and affect.   Results for orders placed or  performed during the hospital encounter of 02/19/18 (from the past 24 hour(s))  Urinalysis, Routine w reflex microscopic     Status: Abnormal   Collection Time: 02/19/18 10:05 AM  Result Value Ref Range   Color, Urine YELLOW YELLOW   APPearance CLEAR CLEAR   Specific Gravity, Urine 1.011 1.005 - 1.030   pH 5.0 5.0 - 8.0   Glucose, UA NEGATIVE NEGATIVE mg/dL   Hgb urine dipstick LARGE (A) NEGATIVE   Bilirubin Urine NEGATIVE NEGATIVE   Ketones, ur NEGATIVE NEGATIVE mg/dL   Protein, ur NEGATIVE NEGATIVE mg/dL   Nitrite NEGATIVE NEGATIVE   Leukocytes, UA NEGATIVE NEGATIVE   RBC / HPF 6-10 0 - 5 RBC/hpf   WBC, UA 0-5 0 - 5 WBC/hpf   Bacteria, UA RARE (A) NONE SEEN   Squamous Epithelial / LPF 0-5 0 - 5   Mucus PRESENT   Pregnancy, urine POC     Status: None   Collection Time: 02/19/18 10:21 AM  Result Value Ref Range   Preg Test, Ur NEGATIVE NEGATIVE  Wet prep, genital     Status: Abnormal   Collection Time: 02/19/18 10:55 AM  Result Value Ref Range   Yeast Wet Prep HPF POC NONE SEEN NONE SEEN   Trich, Wet Prep NONE SEEN NONE SEEN   Clue Cells Wet Prep HPF POC PRESENT (A) NONE SEEN   WBC, Wet Prep HPF POC FEW (A) NONE SEEN   Sperm NONE SEEN   CBC     Status: None   Collection Time: 02/19/18 11:00 AM  Result Value Ref Range   WBC 9.3 4.0 - 10.5 K/uL   RBC 4.29 3.87 - 5.11 MIL/uL   Hemoglobin 13.4 12.0 - 15.0 g/dL   HCT 13.0 86.5 - 78.4 %   MCV 89.3 78.0 - 100.0 fL   MCH 31.2 26.0 - 34.0 pg   MCHC 35.0 30.0 - 36.0 g/dL   RDW 69.6 29.5 - 28.4 %   Platelets 298 150 - 400 K/uL  hCG, quantitative, pregnancy     Status: None   Collection Time: 02/19/18 11:01 AM  Result Value Ref Range   hCG, Beta Chain, Quant, S <1 <5 mIU/mL   MAU Course  Procedures  MDM Labs ordered and reviewed. No evidence of pregnancy or acute abdominal or pelvic process. Discussed with pt that break through bleeding is common when starting a new oral contraceptive. She should continue to take her OC and  follow up as scheduled with her primary GYN provider. Presentation, clinical findings, and plan discussed with Dr. Amado Nash. Stable for discharge home.  Assessment and Plan   1. Pregnancy examination or test, negative result   2. Breakthrough bleeding on birth control pills    Discharge home Follow  up at [facility] this week as scheduled Rx Ibuprofen  Allergies as of 02/19/2018      Reactions   Amoxicillin Hives, Nausea And Vomiting   Has patient had a PCN reaction causing immediate rash, facial/tongue/throat swelling, SOB or lightheadedness with hypotension: Yes Has patient had a PCN reaction causing severe rash involving mucus membranes or skin necrosis: No Has patient had a PCN reaction that required hospitalization No Has patient had a PCN reaction occurring within the last 10 years: No If all of the above answers are "NO", then may proceed with Cephalosporin use.      Medication List    TAKE these medications   acetaminophen 325 MG tablet Commonly known as:  TYLENOL Take 650 mg by mouth every 6 (six) hours as needed for mild pain or headache.   ibuprofen 600 MG tablet Commonly known as:  ADVIL,MOTRIN Take 1 tablet (600 mg total) by mouth every 6 (six) hours as needed.   JENCYCLA 0.35 MG tablet Generic drug:  norethindrone Take 1 tablet by mouth at bedtime.      Donette Larry, CNM 02/19/2018, 1:27 PM

## 2018-02-19 NOTE — Discharge Instructions (Signed)
Norethindrone tablets (contraception) What is this medicine? NORETHINDRONE (nor eth IN drone) is an oral contraceptive. The product contains a female hormone known as a progestin. It is used to prevent pregnancy. This medicine may be used for other purposes; ask your health care provider or pharmacist if you have questions. COMMON BRAND NAME(S): Camila, Deblitane 28-Day, Errin, Heather, Jencycla, Jolivette, Lyza, Nor-QD, Nora-BE, Norlyroc, Ortho Micronor, Sharobel 28-Day What should I tell my health care provider before I take this medicine? They need to know if you have any of these conditions: -blood vessel disease or blood clots -breast, cervical, or vaginal cancer -diabetes -heart disease -kidney disease -liver disease -mental depression -migraine -seizures -stroke -vaginal bleeding -an unusual or allergic reaction to norethindrone, other medicines, foods, dyes, or preservatives -pregnant or trying to get pregnant -breast-feeding How should I use this medicine? Take this medicine by mouth with a glass of water. You may take it with or without food. Follow the directions on the prescription label. Take this medicine at the same time each day and in the order directed on the package. Do not take your medicine more often than directed. Contact your pediatrician regarding the use of this medicine in children. Special care may be needed. This medicine has been used in female children who have started having menstrual periods. A patient package insert for the product will be given with each prescription and refill. Read this sheet carefully each time. The sheet may change frequently. Overdosage: If you think you have taken too much of this medicine contact a poison control center or emergency room at once. NOTE: This medicine is only for you. Do not share this medicine with others. What if I miss a dose? Try not to miss a dose. Every time you miss a dose or take a dose late your chance of  pregnancy increases. When 1 pill is missed (even if only 3 hours late), take the missed pill as soon as possible and continue taking a pill each day at the regular time (use a back up method of birth control for the next 48 hours). If more than 1 dose is missed, use an additional birth control method for the rest of your pill pack until menses occurs. Contact your health care professional if more than 1 dose has been missed. What may interact with this medicine? Do not take this medicine with any of the following medications: -amprenavir or fosamprenavir -bosentan This medicine may also interact with the following medications: -antibiotics or medicines for infections, especially rifampin, rifabutin, rifapentine, and griseofulvin, and possibly penicillins or tetracyclines -aprepitant -barbiturate medicines, such as phenobarbital -carbamazepine -felbamate -modafinil -oxcarbazepine -phenytoin -ritonavir or other medicines for HIV infection or AIDS -St. John's wort -topiramate This list may not describe all possible interactions. Give your health care provider a list of all the medicines, herbs, non-prescription drugs, or dietary supplements you use. Also tell them if you smoke, drink alcohol, or use illegal drugs. Some items may interact with your medicine. What should I watch for while using this medicine? Visit your doctor or health care professional for regular checks on your progress. You will need a regular breast and pelvic exam and Pap smear while on this medicine. Use an additional method of birth control during the first cycle that you take these tablets. If you have any reason to think you are pregnant, stop taking this medicine right away and contact your doctor or health care professional. If you are taking this medicine for hormone related problems, it   may take several cycles of use to see improvement in your condition. This medicine does not protect you against HIV infection (AIDS)  or any other sexually transmitted diseases. What side effects may I notice from receiving this medicine? Side effects that you should report to your doctor or health care professional as soon as possible: -breast tenderness or discharge -pain in the abdomen, chest, groin or leg -severe headache -skin rash, itching, or hives -sudden shortness of breath -unusually weak or tired -vision or speech problems -yellowing of skin or eyes Side effects that usually do not require medical attention (report to your doctor or health care professional if they continue or are bothersome): -changes in sexual desire -change in menstrual flow -facial hair growth -fluid retention and swelling -headache -irritability -nausea -weight gain or loss This list may not describe all possible side effects. Call your doctor for medical advice about side effects. You may report side effects to FDA at 1-800-FDA-1088. Where should I keep my medicine? Keep out of the reach of children. Store at room temperature between 15 and 30 degrees C (59 and 86 degrees F). Throw away any unused medicine after the expiration date. NOTE: This sheet is a summary. It may not cover all possible information. If you have questions about this medicine, talk to your doctor, pharmacist, or health care provider.  2018 Elsevier/Gold Standard (2012-06-22 16:41:35)  

## 2018-02-20 LAB — GC/CHLAMYDIA PROBE AMP (~~LOC~~) NOT AT ARMC
Chlamydia: NEGATIVE
Neisseria Gonorrhea: NEGATIVE

## 2019-05-06 IMAGING — DX DG CHEST 2V
2 series · 2 of 2 positions shown · non-contrast
Comparison: PA and lateral chest x-ray September 11, 2013

CLINICAL DATA: Mid and left-sided chest pain since yesterday
without associated symptoms. No history of asthma. Nonsmoker.

EXAM:
CHEST  2 VIEW

[chest pa]
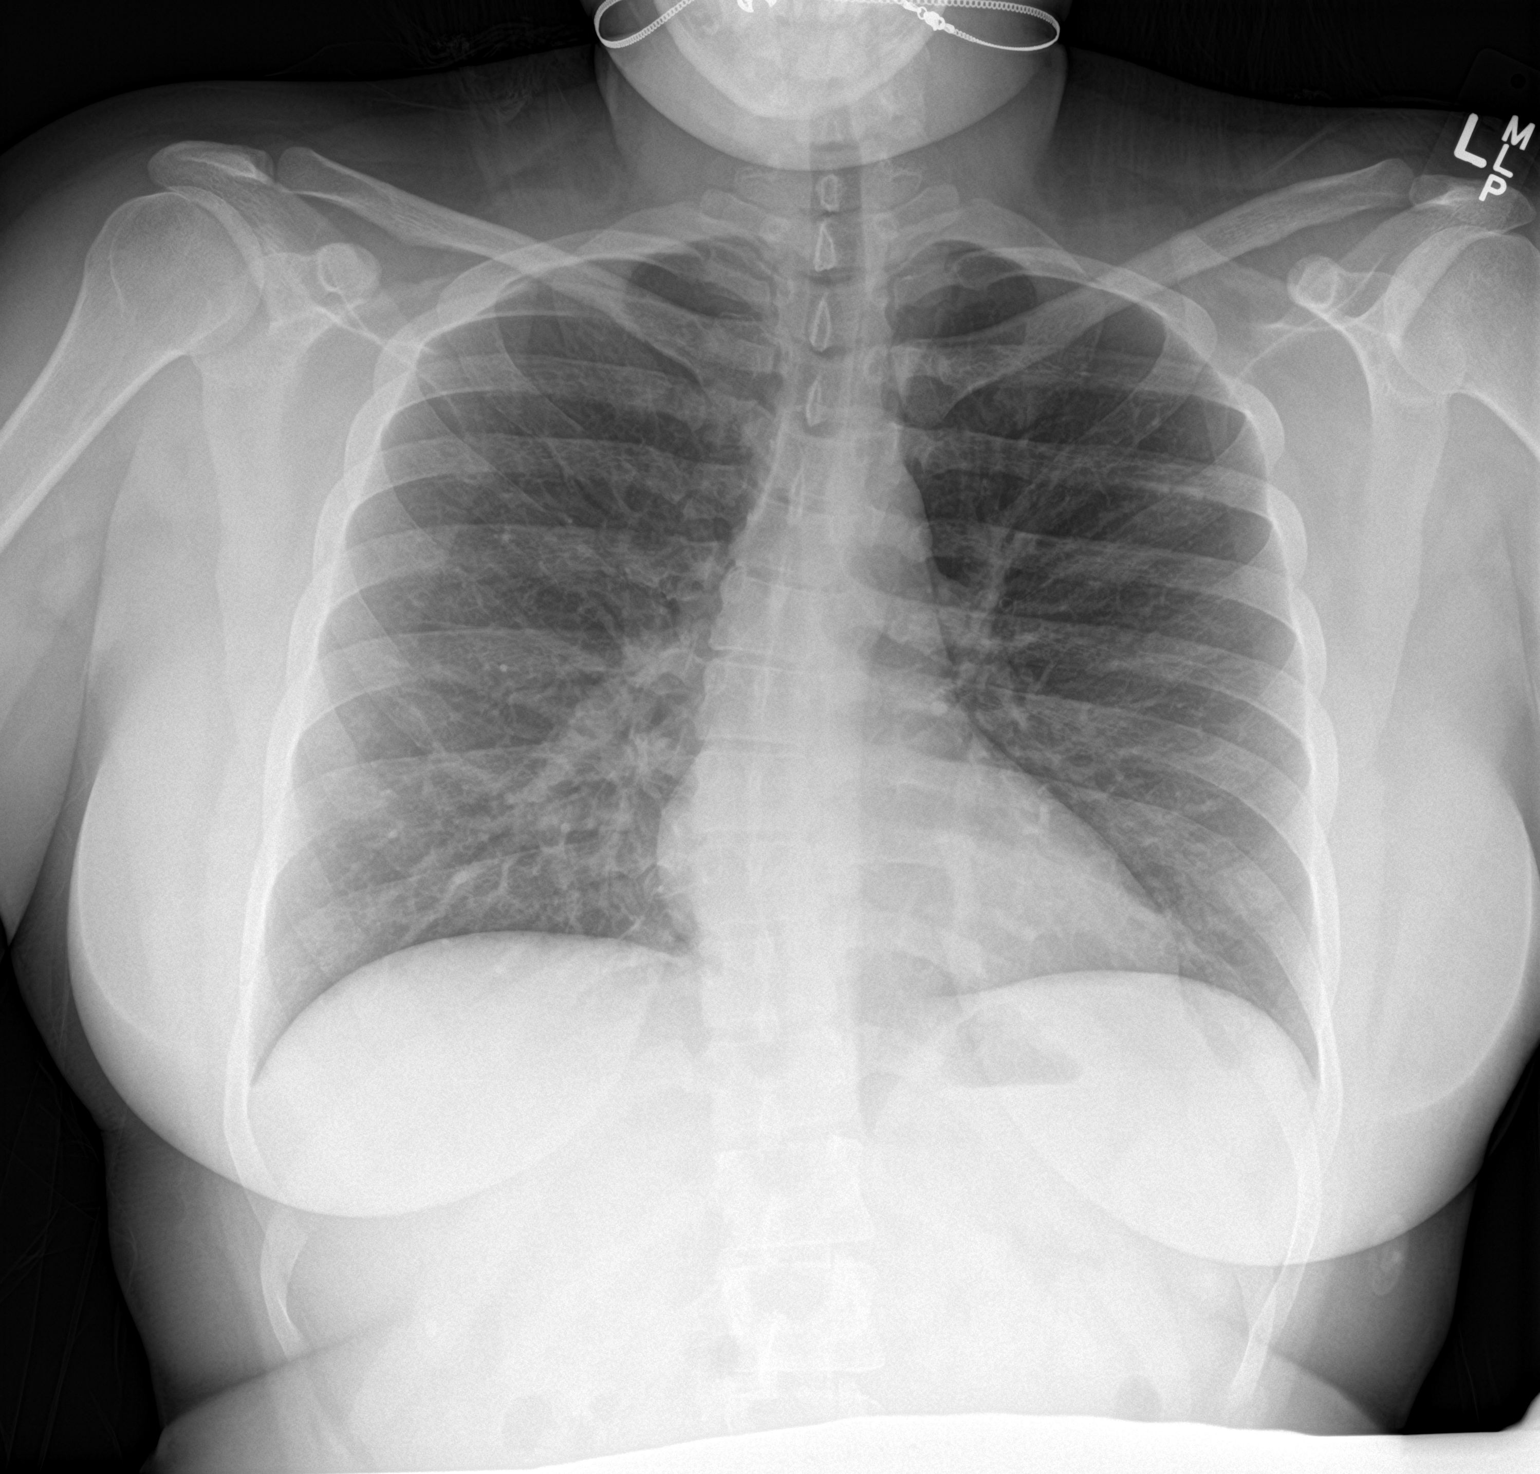

[chest lat]
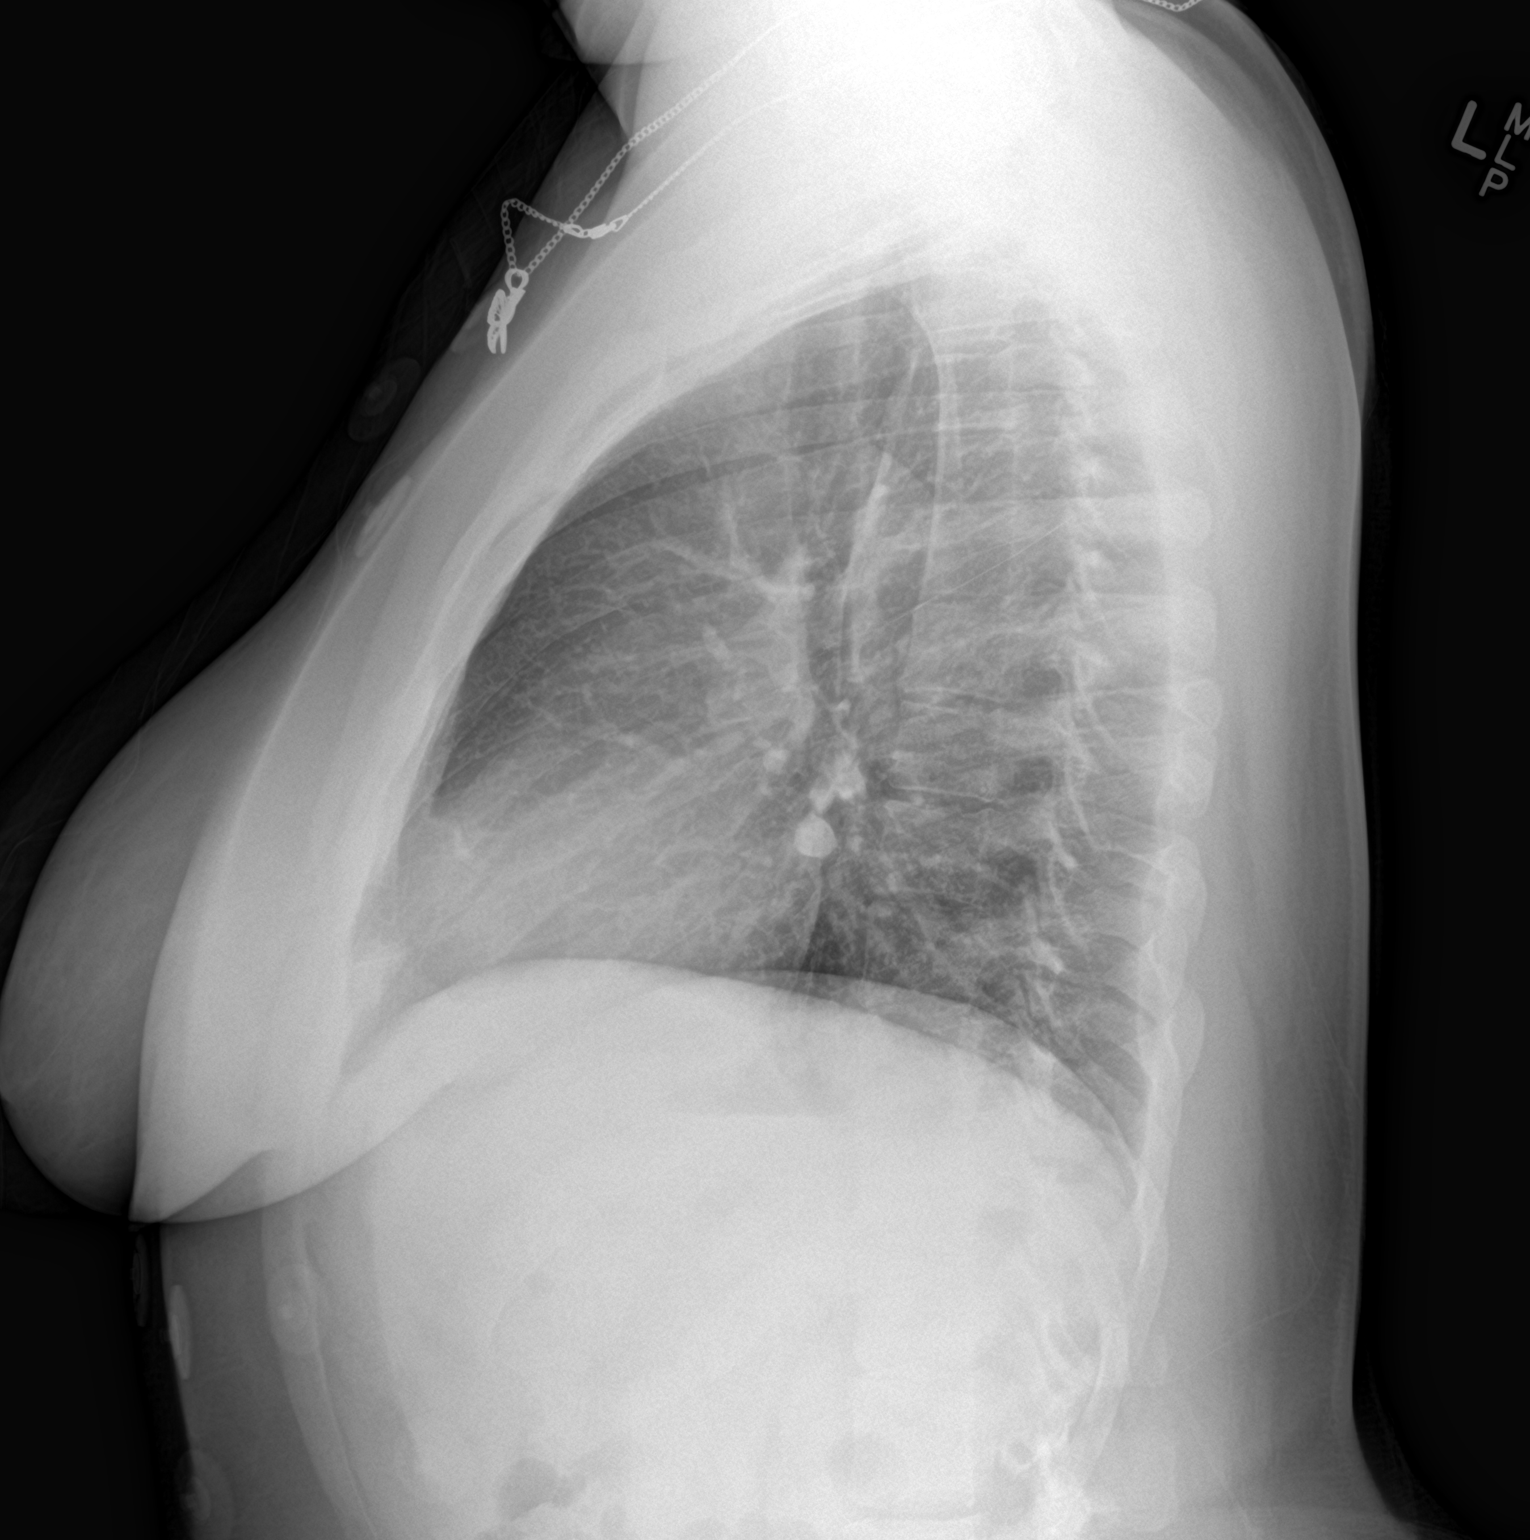

[2 of 2 positions shown; findings below may reference images not displayed]

FINDINGS: The lungs are adequately inflated. The interstitial markings are
mildly prominent. There is no alveolar infiltrate. There is no
pleural effusion. The heart and pulmonary vascularity are normal.
The trachea is midline. There is gentle curvature centered in the
midthoracic spine convex toward the right. The bony thorax exhibits
no acute abnormality.
IMPRESSION: No acute cardiopulmonary abnormality.

Gentle dextrocurvature centered in the midthoracic spine which is
stable.

## 2019-10-30 ENCOUNTER — Ambulatory Visit: Payer: Medicaid Other | Attending: Internal Medicine

## 2019-10-30 DIAGNOSIS — Z20822 Contact with and (suspected) exposure to covid-19: Secondary | ICD-10-CM

## 2019-10-31 LAB — NOVEL CORONAVIRUS, NAA: SARS-CoV-2, NAA: NOT DETECTED

## 2019-11-14 ENCOUNTER — Ambulatory Visit: Payer: Medicaid Other | Attending: Internal Medicine

## 2019-11-14 DIAGNOSIS — Z20822 Contact with and (suspected) exposure to covid-19: Secondary | ICD-10-CM

## 2019-11-15 LAB — NOVEL CORONAVIRUS, NAA: SARS-CoV-2, NAA: NOT DETECTED

## 2020-04-22 ENCOUNTER — Encounter (HOSPITAL_COMMUNITY): Payer: Self-pay | Admitting: Emergency Medicine

## 2020-04-22 ENCOUNTER — Emergency Department (HOSPITAL_COMMUNITY)
Admission: EM | Admit: 2020-04-22 | Discharge: 2020-04-22 | Disposition: A | Discharge status: Home / Self Care | Payer: Medicaid Other | Attending: Emergency Medicine | Admitting: Emergency Medicine

## 2020-04-22 ENCOUNTER — Other Ambulatory Visit: Payer: Self-pay

## 2020-04-22 DIAGNOSIS — H53149 Visual discomfort, unspecified: Secondary | ICD-10-CM | POA: Insufficient documentation

## 2020-04-22 DIAGNOSIS — R110 Nausea: Secondary | ICD-10-CM | POA: Insufficient documentation

## 2020-04-22 DIAGNOSIS — J45909 Unspecified asthma, uncomplicated: Secondary | ICD-10-CM | POA: Diagnosis not present

## 2020-04-22 DIAGNOSIS — R519 Headache, unspecified: Secondary | ICD-10-CM | POA: Insufficient documentation

## 2020-04-22 DIAGNOSIS — R5383 Other fatigue: Secondary | ICD-10-CM | POA: Diagnosis not present

## 2020-04-22 LAB — CBC WITH DIFFERENTIAL/PLATELET
Abs Immature Granulocytes: 0.03 10*3/uL (ref 0.00–0.07)
Basophils Absolute: 0.1 10*3/uL (ref 0.0–0.1)
Basophils Relative: 1 %
Eosinophils Absolute: 0.5 10*3/uL (ref 0.0–0.5)
Eosinophils Relative: 4 %
HCT: 39.8 % (ref 36.0–46.0)
Hemoglobin: 13.2 g/dL (ref 12.0–15.0)
Immature Granulocytes: 0 %
Lymphocytes Relative: 25 %
Lymphs Abs: 2.9 10*3/uL (ref 0.7–4.0)
MCH: 30.1 pg (ref 26.0–34.0)
MCHC: 33.2 g/dL (ref 30.0–36.0)
MCV: 90.7 fL (ref 80.0–100.0)
Monocytes Absolute: 0.6 10*3/uL (ref 0.1–1.0)
Monocytes Relative: 5 %
Neutro Abs: 7.7 10*3/uL (ref 1.7–7.7)
Neutrophils Relative %: 65 %
Platelets: 390 10*3/uL (ref 150–400)
RBC: 4.39 MIL/uL (ref 3.87–5.11)
RDW: 14.3 % (ref 11.5–15.5)
WBC: 11.8 10*3/uL — ABNORMAL HIGH (ref 4.0–10.5)
nRBC: 0 % (ref 0.0–0.2)

## 2020-04-22 LAB — I-STAT BETA HCG BLOOD, ED (MC, WL, AP ONLY): I-stat hCG, quantitative: <5 m[IU]/mL (ref <5)

## 2020-04-22 LAB — BASIC METABOLIC PANEL
Anion gap: 11 (ref 5–15)
BUN: 13 mg/dL (ref 6–20)
CO2: 25 mmol/L (ref 22–32)
Calcium: 9.4 mg/dL (ref 8.9–10.3)
Chloride: 103 mmol/L (ref 98–111)
Creatinine, Ser: 0.93 mg/dL (ref 0.44–1.00)
GFR calc Af Amer: >60 mL/min (ref >60)
GFR calc non Af Amer: >60 mL/min (ref >60)
Glucose, Bld: 92 mg/dL (ref 70–99)
Potassium: 4.9 mmol/L (ref 3.5–5.1)
Sodium: 139 mmol/L (ref 135–145)

## 2020-04-22 MED ORDER — KETOROLAC TROMETHAMINE 30 MG/ML IJ SOLN
30.0000 mg | Freq: Once | INTRAMUSCULAR | Status: AC
  Administered 2020-04-22: 30 mg via INTRAVENOUS
  Filled 2020-04-22: qty 1

## 2020-04-22 MED ORDER — ONDANSETRON 4 MG PO TBDP: 4.0000 mg | ORAL_TABLET | Freq: Three times a day (TID) | ORAL | 0 refills | Status: AC | PRN

## 2020-04-22 MED ORDER — SODIUM CHLORIDE 0.9 % IV BOLUS
1000.0000 mL | Freq: Once | INTRAVENOUS | Status: AC
  Administered 2020-04-22: 1000 mL via INTRAVENOUS

## 2020-04-22 MED ORDER — ONDANSETRON HCL 4 MG/2ML IJ SOLN
4.0000 mg | Freq: Once | INTRAMUSCULAR | Status: AC
  Administered 2020-04-22: 4 mg via INTRAVENOUS
  Filled 2020-04-22: qty 2

## 2020-04-22 MED ORDER — NAPROXEN 500 MG PO TABS: 500.0000 mg | ORAL_TABLET | Freq: Two times a day (BID) | ORAL | 0 refills | Status: AC

## 2020-04-22 MED ORDER — PANTOPRAZOLE SODIUM 40 MG IV SOLR
40.0000 mg | Freq: Once | INTRAVENOUS | Status: AC
  Administered 2020-04-22: 40 mg via INTRAVENOUS
  Filled 2020-04-22: qty 40

## 2020-04-22 NOTE — Discharge Instructions (Signed)
Your work-up here today was reassuring.    Please take your medications, as directed.  Please discontinue should you become pregnant or breast-feeding.  I would like you to follow-up with your OB/GYN provider for ongoing evaluation and management.  Your headache will likely resolve spontaneously with conservative treatment.  In the meantime, please ensure that you are eating and drinking regularly.    Return to the ED or seek immediate medical attention should experience any new or worsening symptoms.

## 2020-04-22 NOTE — ED Triage Notes (Signed)
Pt c/o headache on right side since woke up today with nausea and little blurred vision.

## 2020-04-22 NOTE — ED Provider Notes (Signed)
[facility]-EMERGENCY DEPT Provider Note   CSN: [id number] Arrival date & time: 04/22/20  1518     History Chief Complaint  Patient presents with  . Headache  . Nausea    [name] is a 29 y.o. female with PMH of PCOS who presents to the ED with complaints of headache.  Patient reports that shortly after waking up this morning she developed a "throbbing" right-sided headache that has progressively worsened in intensity.  She also endorses photophobia, nausea, and fatigue.  She states that she rarely gets headaches, but this is comparable to ones that she has been treated for in the past.  She felt perfectly fine last night prior to going to bed.  She does state that she has had intermittent "pressure" sensation behind her eyes intermittently over the course of the past couple of months, but denies any other symptoms of sinus congestion such as rhinorrhea, sore throat, watery eyes, or ear discomfort.  She also denies any fevers, chills, chest pain or cough, shortness of breath, abdominal pain, vomiting, numbness or weakness, room spinning dizziness, or other symptoms.  She states that she has not eaten well today due to her nausea symptoms.  Patient does report that her last menses began on 04/16/2020, subsided yesterday, and was reportedly heavier than usual.  Patient is not breast-feeding.  HPI     Past Medical History:  Diagnosis Date  . Asthma   . Hyperthyroidism   . PCOS (polycystic ovarian syndrome)   . Scoliosis   . Scoliosis     Patient Active Problem List   Diagnosis Date Noted  . Amenorrhea 10/30/2017  . Status post primary low transverse cesarean section 01/31/2016  . Delayed delivery after SROM (spontaneous rupture of membranes) 01/28/2016  . 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 Allergy to amoxicillin 01/06/2016  . Scoliosis 07/16/2015  . Hyperthyroidism 07/16/2015  . Asthma 07/16/2015  . Sickle cell trait  (HCC) 07/16/2015    Past Surgical History:  Procedure Laterality Date  . CESAREAN SECTION N/A 01/29/2016   Procedure: CESAREAN SECTION;  Surgeon: Geryl Rankins, MD;  Location: [facility];  Service: Obstetrics;  Laterality: N/A;     OB History    Gravida  2   Para  1   Term  1   Preterm      AB  1   Living  1     SAB  1   TAB      Ectopic      Multiple  0   Live Births  1           Family History  Problem Relation Age of Onset  . Diabetes Mother        type 2  . Hypothyroidism Mother   . Asthma Father   . Asthma Sister   . Hypothyroidism Sister     Social History   Tobacco Use  . Smoking status: Never Smoker  . Smokeless tobacco: Never Used  Vaping Use  . Vaping Use: Never used  Substance Use Topics  . Alcohol use: No  . Drug use: No    Home Medications Prior to Admission medications   Medication Sig Start Date End Date Taking? Authorizing Provider  acetaminophen (TYLENOL) 325 MG tablet Take 650 mg by mouth every 6 (six) hours as needed for mild pain or headache.    [provider]  ibuprofen (ADVIL,MOTRIN) 600 MG tablet Take 1 tablet (600 mg total) by mouth  every 6 (six) hours as needed. 02/19/18   Donette Larry, CNM  JENCYCLA 0.35 MG tablet Take 1 tablet by mouth at bedtime. 11/25/16   [provider]  naproxen (NAPROSYN) 500 MG tablet Take 1 tablet (500 mg total) by mouth 2 (two) times daily. 04/22/20   Lorelee New, PA-C  ondansetron (ZOFRAN ODT) 4 MG disintegrating tablet Take 1 tablet (4 mg total) by mouth every 8 (eight) hours as needed for nausea or vomiting. 04/22/20   Lorelee New, PA-C    Allergies    Amoxicillin  Review of Systems   Review of Systems  All other systems reviewed and are negative.   Physical Exam Updated Vital Signs BP 136/89 (BP Location: Left Arm)   Pulse 84   Temp 98.8 F (37.1 C) (Oral)   Resp 16   LMP 04/16/2020   SpO2 99%   Physical Exam Vitals and nursing note reviewed. Exam  conducted with a chaperone present.  Constitutional:      General: She is not in acute distress.    Appearance: Normal appearance. She is not ill-appearing.  HENT:     Head: Normocephalic and atraumatic.     Nose: Nose normal. No congestion.     Mouth/Throat:     Pharynx: Oropharynx is clear.  Eyes:     General: No scleral icterus.    Extraocular Movements: Extraocular movements intact.     Conjunctiva/sclera: Conjunctivae normal.     Pupils: Pupils are equal, round, and reactive to light.     Comments: No pain with EOMs.  No periorbital skin changes.  Neck:     Comments: No meningismus. Cardiovascular:     Rate and Rhythm: Normal rate and regular rhythm.     Pulses: Normal pulses.     Heart sounds: Normal heart sounds.  Pulmonary:     Effort: Pulmonary effort is normal. No respiratory distress.     Breath sounds: Normal breath sounds. No wheezing.  Abdominal:     General: Abdomen is flat. There is no distension.     Palpations: Abdomen is soft.     Tenderness: There is no abdominal tenderness.  Musculoskeletal:        General: Normal range of motion.     Cervical back: Normal range of motion and neck supple. No rigidity.     Right lower leg: No edema.     Left lower leg: No edema.  Skin:    General: Skin is dry.     Capillary Refill: Capillary refill takes less than 2 seconds.  Neurological:     Mental Status: She is alert.     GCS: GCS eye subscore is 4. GCS verbal subscore is 5. GCS motor subscore is 6.     Comments: CN II through XII grossly intact.  No focal deficits.  Extremity range of motion and strength intact.  Sensation intact throughout.  Negative Romberg and cerebellar exams.  Speaks fluently.  PERRL and EOM intact.  No gait abnormality.  Psychiatric:        Mood and Affect: Mood normal.        Behavior: Behavior normal.        Thought Content: Thought content normal.     ED Results / Procedures / Treatments   Labs (all labs ordered are listed, but only  abnormal results are displayed) Labs Reviewed  CBC WITH DIFFERENTIAL/PLATELET - Abnormal; Notable for the following components:      Result Value   WBC 11.8 (*)  All other components within normal limits  BASIC METABOLIC PANEL  I-STAT BETA HCG BLOOD, ED ([facility], [facility], [facility] ONLY)    EKG None  Radiology No results found.  Procedures Procedures (including critical care time)  Medications Ordered in ED Medications  sodium chloride 0.9 % bolus 1,000 mL (0 mLs Intravenous Stopped 04/22/20 2134)  ketorolac (TORADOL) 30 MG/ML injection 30 mg (30 mg Intravenous Given 04/22/20 1945)  ondansetron (ZOFRAN) injection 4 mg (4 mg Intravenous Given 04/22/20 1944)  pantoprazole (PROTONIX) injection 40 mg (40 mg Intravenous Given 04/22/20 2134)    ED Course  I have reviewed the triage vital signs and the nursing notes.  Pertinent labs & imaging results that were available during my care of the patient were reviewed by me and considered in my medical decision making (see chart for details).    MDM Rules/Calculators/A&P                          Patient's right-sided throbbing headache with associated nausea, fatigue, and photosensitivity is consistent with possible migraine.  Patient does not experience migraines with any regularity, she does endorse a history of similar headaches that resolved with IV fluids and medications.  She is denying any neurologic deficits that are concerning for intracranial abnormality.  [facility].  Given her reassuring neuro exam, do not feel as though CT imaging is warranted.  I have lower suspicion for cluster headache or tension headache given her description of symptoms.  Labs CBC: Mild leukocytosis to 11.8, relatively consistent with patient's baseline.  Hemoglobin WNL at 13.2. BMP: WNL. Beta-hCG: Negative for pregnancy.  On subsequent evaluation, patient is feeling improved after 30 mg Toradol IV and 4 mg Zofran IV.  The 1 L IV NS is  continuing to run.  She states that her headache has almost entirely improved, but she continues to endorse mild nausea symptoms with mild epigastric "gurgling".  Will provide 40 mg IV Protonix and work on patient's discharge paperwork.  She reports that she she is a provider at [facility] as her PCP given her history of PCOS and plans to follow-up with them regarding today's encounter.    On final examination, patient is feeling improved.  She feels prepared for discharge.  All of the evaluation and work-up results were discussed with the patient and any family at bedside.  Patient and/or family were informed that while patient is appropriate for discharge at this time, some medical emergencies may only develop or become detectable after a period of time.  I specifically instructed patient and/or family to return to return to the ED or seek immediate medical attention for any new or worsening symptoms.  They were provided opportunity to ask any additional questions and have none at this time.  Prior to discharge patient is feeling well, agreeable with plan for discharge home.  They have expressed understanding of verbal discharge instructions as well as return precautions and are agreeable to the plan.    Final Clinical Impression(s) / ED Diagnoses Final diagnoses:  Acute nonintractable headache, unspecified headache type    Rx / DC Orders ED Discharge Orders         Ordered    ondansetron (ZOFRAN ODT) 4 MG disintegrating tablet  Every 8 hours PRN     Discontinue  Reprint     04/22/20 2103    naproxen (NAPROSYN) 500 MG tablet  2 times daily     Discontinue  Reprint     04/22/20 2103           Lorelee New, PA-C 04/22/20 2135    Linwood Dibbles, MD 04/23/20 1053
